# Patient Record
Sex: Male | Born: 1990 | Hispanic: No | Marital: Single | Smoking: Former smoker
Health system: Southern US, Community
[De-identification: ages and names within clinical notes are randomized; demographics above are authoritative.]

## PROBLEM LIST (undated history)

## (undated) DIAGNOSIS — R51 Headache: Secondary | ICD-10-CM

## (undated) DIAGNOSIS — R519 Headache, unspecified: Secondary | ICD-10-CM

## (undated) DIAGNOSIS — B009 Herpesviral infection, unspecified: Secondary | ICD-10-CM

## (undated) HISTORY — PX: NO PAST SURGERIES: SHX2092

---

## 2014-06-27 ENCOUNTER — Encounter (HOSPITAL_COMMUNITY): Payer: Self-pay | Admitting: Emergency Medicine

## 2014-06-27 ENCOUNTER — Emergency Department (INDEPENDENT_AMBULATORY_CARE_PROVIDER_SITE_OTHER)
Admission: EM | Admit: 2014-06-27 | Discharge: 2014-06-27 | Disposition: A | Discharge status: Home / Self Care | Payer: Self-pay | Source: Home / Self Care | Attending: Family Medicine | Admitting: Family Medicine

## 2014-06-27 DIAGNOSIS — B349 Viral infection, unspecified: Secondary | ICD-10-CM

## 2014-06-27 DIAGNOSIS — B001 Herpesviral vesicular dermatitis: Secondary | ICD-10-CM

## 2014-06-27 MED ORDER — VALACYCLOVIR HCL 1 G PO TABS: 1000.0000 mg | ORAL_TABLET | Freq: Two times a day (BID) | ORAL | Status: DC

## 2014-06-27 MED ORDER — VALACYCLOVIR HCL 1 G PO TABS: ORAL_TABLET | ORAL | Status: DC

## 2014-06-27 NOTE — ED Notes (Signed)
Triage delay secondary to this nurse performing procedure in department.

## 2014-06-27 NOTE — Discharge Instructions (Signed)
Cold Sore A cold sore (fever blister) is a skin infection caused by the herpes simplex virus (HSV-1). HSV-1 is closely related to the virus that causes genital herpes (HSV-2), but they are not the same even though both viruses can cause oral and genital infections. Cold sores are small, fluid-filled sores inside of the mouth or on the lips, gums, nose, chin, cheeks, or fingers.  The herpes simplex virus can be easily passed (contagious) to other people through close personal contact, such as kissing or sharing personal items. The virus can also spread to other parts of the body, such as the eyes or genitals. Cold sores are contagious until the sores crust over completely. They often heal within 2 weeks.  Once a person is infected, the herpes simplex virus remains permanently in the body. Therefore, there is no cure for cold sores, and they often recur when a person is tired, stressed, sick, or gets too much sun. Additional factors that can cause a recurrence include hormone changes in menstruation or pregnancy, certain drugs, and cold weather.  CAUSES  Cold sores are caused by the herpes simplex virus. The virus is spread from person to person through close contact, such as through kissing, touching the affected area, or sharing personal items such as lip balm, razors, or eating utensils.  SYMPTOMS  The first infection may not cause symptoms. If symptoms develop, the symptoms often go through different stages. Here is how a cold sore develops:   Tingling, itching, or burning is felt 1-2 days before the outbreak.   Fluid-filled blisters appear on the lips, inside the mouth, nose, or on the cheeks.   The blisters start to ooze clear fluid.   The blisters dry up and a yellow crust appears in its place.   The crust falls off.  Symptoms depend on whether it is the initial outbreak or a recurrence. Some other symptoms with the first outbreak may include:   Fever.   Sore throat.   Headache.    Muscle aches.   Swollen neck glands.  DIAGNOSIS  A diagnosis is often made based on your symptoms and looking at the sores. Sometimes, a sore may be swabbed and then examined in the lab to make a final diagnosis. If the sores are not present, blood tests can find the herpes simplex virus.  TREATMENT  There is no cure for cold sores and no vaccine for the herpes simplex virus. Within 2 weeks, most cold sores go away on their own without treatment. Medicines cannot make the infection go away, but medicine can help relieve some of the pain associated with the sores, can work to stop the virus from multiplying, and can also shorten healing time. Medicine may be in the form of creams, gels, pills, or a shot.  HOME CARE INSTRUCTIONS   Only take over-the-counter or prescription medicines for pain, discomfort, or fever as directed by your caregiver. Do not use aspirin.   Use a cotton-tip swab to apply creams or gels to your sores.   Do not touch the sores or pick the scabs. Wash your hands often. Do not touch your eyes without washing your hands first.   Avoid kissing, oral sex, and sharing personal items until sores heal.   Apply an ice pack on your sores for 10-15 minutes to ease any discomfort.   Avoid hot, cold, or salty foods because they may hurt your mouth. Eat a soft, bland diet to avoid irritating the sores. Use a straw to drink   if you have pain when drinking out of a glass.   Keep sores clean and dry to prevent an infection of other tissues.   Avoid the sun and limit stress if these things trigger outbreaks. If sun causes cold sores, apply sunscreen on the lips before being out in the sun.  SEEK MEDICAL CARE IF:   You have a fever or persistent symptoms for more than 2-3 days.   You have a fever and your symptoms suddenly get worse.   You have pus, not clear fluid, coming from the sores.   You have redness that is spreading.   You have pain or irritation in your  eye.   You get sores on your genitals.   Your sores do not heal within 2 weeks.   You have a weakened immune system.   You have frequent recurrences of cold sores.  MAKE SURE YOU:   Understand these instructions.  Will watch your condition.  Will get help right away if you are not doing well or get worse. Document Released: 08/01/2000 Document Revised: 12/19/2013 Document Reviewed: 12/17/2011 [facility]Exit[facility] Patient Information 2015 [place]ExitCare, [place]LLC. This information is not intended to replace advice given to you by your health care provider. Make sure you discuss any questions you have with your health care provider.  Herpes Labialis You have a fever blister or cold sore (herpes labialis). These painful, grouped sores are caused by one of the herpes viruses (HSV1 most commonly). They are usually found around the lips and mouth, but the same infection can also affect other areas on the face such as the nose and eyes. Herpes infections take about 10 days to heal. They often occur again and again in the same spot. Other symptoms may include numbness and tingling in the involved skin, achiness, fever, and swollen glands in the neck. Colds, emotional stress, injuries, or excess sunlight exposure all seem to make herpes reappear. Herpes lip infections are contagious. Direct contact with these sores can spread the infection. It can also be spread to other parts of your own body. TREATMENT  Herpes labialis is usually self-limited and resolves within 1 week. To reduce pain and swelling, apply ice packs frequently to the sores or suck on popsicles or frozen juice bars. Antiviral medicine may be used by mouth to shorten the duration of the breakout. Avoid spreading the infection by washing your hands often. Be careful not to touch your eyes or genital areas after handling the infected blisters. Do not kiss or have other intimate contact with others. After the blisters are completely healed you may resume  contact. Use sunscreen to lessen recurrences.  If this is your first infection with herpes, or if you have a severe or repeated infections, your caregiver may prescribe one of the anti-viral drugs to speed up the healing. If you have sun-related flare-ups despite the use of sunscreen, starting oral anti-viral medicine before a prolonged exposure (going skiing or to the beach) can prevent most episodes.  SEEK IMMEDIATE MEDICAL CARE IF:  You develop a headache, sleepiness, high fever, vomiting, or severe weakness.  You have eye irritation, pain, blurred vision or redness.  You develop a prolonged infection not getting better in 10 days. Document Released: 08/04/2005 Document Revised: 10/27/2011 Document Reviewed: 06/08/2009 [facility] IncExitCare Patient Information 2015 [place]ExitCare, [place]LLC. This information is not intended to replace advice given to you by your health care provider. Make sure you discuss any questions you have with your health care provider.  Viral Infections A viral  infection can be caused by different types of viruses.Most viral infections are not serious and resolve on their own. However, some infections may cause severe symptoms and may lead to further complications. SYMPTOMS Viruses can frequently cause:  Minor sore throat.  Aches and pains.  Headaches.  Runny nose.  Different types of rashes.  Watery eyes.  Tiredness.  Cough.  Loss of appetite.  Gastrointestinal infections, resulting in nausea, vomiting, and diarrhea. These symptoms do not respond to antibiotics because the infection is not caused by bacteria. However, you might catch a bacterial infection following the viral infection. This is sometimes called a "superinfection." Symptoms of such a bacterial infection may include:  Worsening sore throat with pus and difficulty swallowing.  Swollen neck glands.  Chills and a high or persistent fever.  Severe headache.  Tenderness over the sinuses.  Persistent  overall ill feeling (malaise), muscle aches, and tiredness (fatigue).  Persistent cough.  Yellow, green, or brown mucus production with coughing. HOME CARE INSTRUCTIONS   Only take over-the-counter or prescription medicines for pain, discomfort, diarrhea, or fever as directed by your caregiver.  Drink enough water and fluids to keep your urine clear or pale yellow. Sports drinks can provide valuable electrolytes, sugars, and hydration.  Get plenty of rest and maintain proper nutrition. Soups and broths with crackers or rice are fine. SEEK IMMEDIATE MEDICAL CARE IF:   You have severe headaches, shortness of breath, chest pain, neck pain, or an unusual rash.  You have uncontrolled vomiting, diarrhea, or you are unable to keep down fluids.  You or your child has an oral temperature above 102 F (38.9 C), not controlled by medicine.  Your baby is older than 3 months with a rectal temperature of 102 F (38.9 C) or higher.  Your baby is 793 months old or younger with a rectal temperature of 100.4 F (38 C) or higher. MAKE SURE YOU:   Understand these instructions.  Will watch your condition.  Will get help right away if you are not doing well or get worse. Document Released: 05/14/2005 Document Revised: 10/27/2011 Document Reviewed: 12/09/2010 [facility]ExitCare Patient Information 2015 [place]ExitCare, [place]LLC. This information is not intended to replace advice given to you by your health care provider. Make sure you discuss any questions you have with your health care provider.

## 2014-06-27 NOTE — ED Notes (Signed)
Outbreak of known rash, general not feeling well

## 2014-06-27 NOTE — ED Provider Notes (Signed)
CSN: [id number]     Arrival date & time 06/27/14  1030 History   First MD Initiated Contact with Patient 06/27/14 1056     No chief complaint on file.  (Consider location/radiation/quality/duration/timing/severity/associated sxs/prior Treatment) HPI Comments: Awoke with mild headache and stomach discomfort this AM. C/O rash and requests acyclovir refill.    No past medical history on file. No past surgical history on file. No family history on file. History  Substance Use Topics  . Smoking status: Not on file  . Smokeless tobacco: Not on file  . Alcohol Use: Not on file    Review of Systems  Constitutional: Negative for fever, activity change and fatigue.  HENT: Positive for mouth sores. Negative for postnasal drip, rhinorrhea and sore throat.        Points to right lower lip as the site of a "breaking out".  Eyes: Negative.   Respiratory: Negative.   Cardiovascular: Negative.   Gastrointestinal: Positive for vomiting. Negative for abdominal pain and diarrhea.  Genitourinary: Negative.   Neurological: Negative.     Allergies  Review of patient's allergies indicates not on file.  Home Medications   Prior to Admission medications   Medication Sig Start Date End Date Taking? Authorizing Provider  valACYclovir (VALTREX) 1000 MG tablet Take 1 tablet (1,000 mg total) by mouth 2 (two) times daily. 06/27/14   Hayden Rasmussenavid Catcher Dehoyos, NP   BP 114/76 mmHg  Pulse 54  Temp(Src) 97.6 F (36.4 C) (Oral)  Resp 12  SpO2 100% Physical Exam  Constitutional: He appears well-developed and well-nourished. No distress.  HENT:  Group of papulovesicular lesions to the lower lip.  Eyes: Conjunctivae and EOM are normal.  Neck: Normal range of motion. Neck supple.  Cardiovascular: Normal rate, regular rhythm and normal heart sounds.   Pulmonary/Chest: Effort normal and breath sounds normal. No respiratory distress.  Abdominal: Soft. He exhibits no distension and no mass. There is no tenderness. There  is no rebound and no guarding.  Musculoskeletal: He exhibits no edema.  Lymphadenopathy:    He has no cervical adenopathy.  Neurological: He is alert.  Skin: Skin is warm and dry.  Psychiatric: He has a normal mood and affect.  Nursing note and vitals reviewed.   ED Course  Procedures (including critical care time) Labs Review Labs Reviewed - No data to display  Imaging Review No results found.   MDM   1. Viral syndrome   2. Herpes simplex labialis    valcyclovir 2gm q 12h x 2 d  Fluids, bland foods Tylenol prn  Hayden Rasmussenavid Britanny Marksberry, NP 06/27/14 1513

## 2014-08-13 ENCOUNTER — Emergency Department (INDEPENDENT_AMBULATORY_CARE_PROVIDER_SITE_OTHER)
Admission: EM | Admit: 2014-08-13 | Discharge: 2014-08-13 | Disposition: A | Discharge status: Home / Self Care | Payer: Self-pay | Source: Home / Self Care | Attending: Family Medicine | Admitting: Family Medicine

## 2014-08-13 ENCOUNTER — Encounter (HOSPITAL_COMMUNITY): Payer: Self-pay | Admitting: Emergency Medicine

## 2014-08-13 DIAGNOSIS — B001 Herpesviral vesicular dermatitis: Secondary | ICD-10-CM

## 2014-08-13 HISTORY — DX: Herpesviral infection, unspecified: B00.9

## 2014-08-13 MED ORDER — VALACYCLOVIR HCL 1 G PO TABS: ORAL_TABLET | ORAL | Status: DC

## 2014-08-13 NOTE — ED Notes (Signed)
Patient reports needing refill of valacyclovir. Requesting refill.

## 2014-08-13 NOTE — ED Notes (Signed)
[facility]  Patient not at school this past semester, planning to return this semester

## 2014-08-13 NOTE — ED Provider Notes (Signed)
CSN: [id number]     Arrival date & time 08/13/14  1058 History   First MD Initiated Contact with Patient 08/13/14 1104     Chief Complaint  Patient presents with  . Medication Refill   (Consider location/radiation/quality/duration/timing/severity/associated sxs/prior Treatment) HPI Comments: C/O exacerbation of herpes labialis. Points to Right lower lip. Requests refill on valtrex.   Past Medical History  Diagnosis Date  . Herpes    History reviewed. No pertinent past surgical history. No family history on file. History  Substance Use Topics  . Smoking status: Never Smoker   . Smokeless tobacco: Not on file  . Alcohol Use: No    Review of Systems  All other systems reviewed and are negative.   Allergies  Morphine and related  Home Medications   Prior to Admission medications   Medication Sig Start Date End Date Taking? Authorizing Provider  valACYclovir (VALTREX) 1000 MG tablet Take 2 tabs q 12h for 2 d 08/13/14   Hayden Rasmussenavid Latica Hohmann, NP   BP 123/67 mmHg  Pulse 62  Temp(Src) 98.2 F (36.8 C) (Oral)  Resp 16  SpO2 97% Physical Exam  Constitutional: He is oriented to person, place, and time. He appears well-developed and well-nourished. No distress.  HENT:  Tenderness with minimal swelling to vermillion of lower lip, right side. No specific lesion observed.  Pulmonary/Chest: Effort normal. No respiratory distress.  Neurological: He is alert and oriented to person, place, and time.  Skin: Skin is warm and dry.  Nursing note and vitals reviewed.   ED Course  Procedures (including critical care time) Labs Review Labs Reviewed - No data to display  Imaging Review No results found.   MDM   1. Herpes labialis     valtrex 2 gm bid for 2 d.    Hayden Rasmussenavid Thereasa Iannello, NP 08/13/14 1122

## 2014-08-13 NOTE — Discharge Instructions (Signed)
Cold Sore  A cold sore (fever blister) is a skin infection caused by the herpes simplex virus (HSV-1). HSV-1 is closely related to the virus that causes genital herpes (HSV-2), but they are not the same even though both viruses can cause oral and genital infections. Cold sores are small, fluid-filled sores inside of the mouth or on the lips, gums, nose, chin, cheeks, or fingers.   The herpes simplex virus can be easily passed (contagious) to other people through close personal contact, such as kissing or sharing personal items. The virus can also spread to other parts of the body, such as the eyes or genitals. Cold sores are contagious until the sores crust over completely. They often heal within 2 weeks.   Once a person is infected, the herpes simplex virus remains permanently in the body. Therefore, there is no cure for cold sores, and they often recur when a person is tired, stressed, sick, or gets too much sun. Additional factors that can cause a recurrence include hormone changes in menstruation or pregnancy, certain drugs, and cold weather.   CAUSES   Cold sores are caused by the herpes simplex virus. The virus is spread from person to person through close contact, such as through kissing, touching the affected area, or sharing personal items such as lip balm, razors, or eating utensils.   SYMPTOMS   The first infection may not cause symptoms. If symptoms develop, the symptoms often go through different stages. Here is how a cold sore develops:   · Tingling, itching, or burning is felt 1-2 days before the outbreak.    · Fluid-filled blisters appear on the lips, inside the mouth, nose, or on the cheeks.    · The blisters start to ooze clear fluid.    · The blisters dry up and a yellow crust appears in its place.    · The crust falls off.    Symptoms depend on whether it is the initial outbreak or a recurrence. Some other symptoms with the first outbreak may include:   · Fever.    · Sore throat.    · Headache.     · Muscle aches.    · Swollen neck glands.    DIAGNOSIS   A diagnosis is often made based on your symptoms and looking at the sores. Sometimes, a sore may be swabbed and then examined in the lab to make a final diagnosis. If the sores are not present, blood tests can find the herpes simplex virus.   TREATMENT   There is no cure for cold sores and no vaccine for the herpes simplex virus. Within 2 weeks, most cold sores go away on their own without treatment. Medicines cannot make the infection go away, but medicine can help relieve some of the pain associated with the sores, can work to stop the virus from multiplying, and can also shorten healing time. Medicine may be in the form of creams, gels, pills, or a shot.   HOME CARE INSTRUCTIONS   · Only take over-the-counter or prescription medicines for pain, discomfort, or fever as directed by your caregiver. Do not use aspirin.    · Use a cotton-tip swab to apply creams or gels to your sores.    · Do not touch the sores or pick the scabs. Wash your hands often. Do not touch your eyes without washing your hands first.    · Avoid kissing, oral sex, and sharing personal items until sores heal.    · Apply an ice pack on your sores for 10-15 minutes to ease any discomfort.    ·   Avoid hot, cold, or salty foods because they may hurt your mouth. Eat a soft, bland diet to avoid irritating the sores. Use a straw to drink if you have pain when drinking out of a glass.    · Keep sores clean and dry to prevent an infection of other tissues.    · Avoid the sun and limit stress if these things trigger outbreaks. If sun causes cold sores, apply sunscreen on the lips before being out in the sun.    SEEK MEDICAL CARE IF:   · You have a fever or persistent symptoms for more than 2-3 days.    · You have a fever and your symptoms suddenly get worse.    · You have pus, not clear fluid, coming from the sores.    · You have redness that is spreading.    · You have pain or irritation in your  eye.    · You get sores on your genitals.    · Your sores do not heal within 2 weeks.    · You have a weakened immune system.    · You have frequent recurrences of cold sores.    MAKE SURE YOU:   · Understand these instructions.  · Will watch your condition.  · Will get help right away if you are not doing well or get worse.  Document Released: 08/01/2000 Document Revised: 12/19/2013 Document Reviewed: 12/17/2011  ExitCare® Patient Information ©2015 [employer], [employer]. This information is not intended to replace advice given to you by your health care provider. Make sure you discuss any questions you have with your health care provider.

## 2016-02-25 ENCOUNTER — Ambulatory Visit (HOSPITAL_COMMUNITY)
Admission: EM | Admit: 2016-02-25 | Discharge: 2016-02-25 | Disposition: A | Discharge status: Home / Self Care | Payer: BLUE CROSS/BLUE SHIELD | Attending: Emergency Medicine | Admitting: Emergency Medicine

## 2016-02-25 ENCOUNTER — Encounter (HOSPITAL_COMMUNITY): Payer: Self-pay | Admitting: Emergency Medicine

## 2016-02-25 DIAGNOSIS — B001 Herpesviral vesicular dermatitis: Secondary | ICD-10-CM

## 2016-02-25 MED ORDER — ACYCLOVIR 800 MG PO TABS: 800.0000 mg | ORAL_TABLET | Freq: Every day | ORAL | Status: DC

## 2016-02-25 NOTE — Discharge Instructions (Signed)
Cold Sore A cold sore (fever blister) is a skin infection caused by the herpes simplex virus (HSV-1). HSV-1 is closely related to the virus that causes genital herpes (HSV-2), but they are not the same even though both viruses can cause oral and genital infections. Cold sores are small, fluid-filled sores inside of the mouth or on the lips, gums, nose, chin, cheeks, or fingers.  The herpes simplex virus can be easily passed (contagious) to other people through close personal contact, such as kissing or sharing personal items. The virus can also spread to other parts of the body, such as the eyes or genitals. Cold sores are contagious until the sores crust over completely. They often heal within 2 weeks.  Once a person is infected, the herpes simplex virus remains permanently in the body. Therefore, there is no cure for cold sores, and they often recur when a person is tired, stressed, sick, or gets too much sun. Additional factors that can cause a recurrence include hormone changes in menstruation or pregnancy, certain drugs, and cold weather.  CAUSES  Cold sores are caused by the herpes simplex virus. The virus is spread from person to person through close contact, such as through kissing, touching the affected area, or sharing personal items such as lip balm, razors, or eating utensils.  SYMPTOMS  The first infection may not cause symptoms. If symptoms develop, the symptoms often go through different stages. Here is how a cold sore develops:   Tingling, itching, or burning is felt 1-2 days before the outbreak.   Fluid-filled blisters appear on the lips, inside the mouth, nose, or on the cheeks.   The blisters start to ooze clear fluid.   The blisters dry up and a yellow crust appears in its place.   The crust falls off.  Symptoms depend on whether it is the initial outbreak or a recurrence. Some other symptoms with the first outbreak may include:   Fever.   Sore throat.   Headache.    Muscle aches.   Swollen neck glands.  DIAGNOSIS  A diagnosis is often made based on your symptoms and looking at the sores. Sometimes, a sore may be swabbed and then examined in the lab to make a final diagnosis. If the sores are not present, blood tests can find the herpes simplex virus.  TREATMENT  There is no cure for cold sores and no vaccine for the herpes simplex virus. Within 2 weeks, most cold sores go away on their own without treatment. Medicines cannot make the infection go away, but medicine can help relieve some of the pain associated with the sores, can work to stop the virus from multiplying, and can also shorten healing time. Medicine may be in the form of creams, gels, pills, or a shot.  HOME CARE INSTRUCTIONS   Only take over-the-counter or prescription medicines for pain, discomfort, or fever as directed by your caregiver. Do not use aspirin.   Use a cotton-tip swab to apply creams or gels to your sores.   Do not touch the sores or pick the scabs. Wash your hands often. Do not touch your eyes without washing your hands first.   Avoid kissing, oral sex, and sharing personal items until sores heal.   Apply an ice pack on your sores for 10-15 minutes to ease any discomfort.   Avoid hot, cold, or salty foods because they may hurt your mouth. Eat a soft, bland diet to avoid irritating the sores. Use a straw to drink   if you have pain when drinking out of a glass.   Keep sores clean and dry to prevent an infection of other tissues.   Avoid the sun and limit stress if these things trigger outbreaks. If sun causes cold sores, apply sunscreen on the lips before being out in the sun.  SEEK MEDICAL CARE IF:   You have a fever or persistent symptoms for more than 2-3 days.   You have a fever and your symptoms suddenly get worse.   You have pus, not clear fluid, coming from the sores.   You have redness that is spreading.   You have pain or irritation in your  eye.   You get sores on your genitals.   Your sores do not heal within 2 weeks.   You have a weakened immune system.   You have frequent recurrences of cold sores.  MAKE SURE YOU:   Understand these instructions.  Will watch your condition.  Will get help right away if you are not doing well or get worse.   This information is not intended to replace advice given to you by your health care provider. Make sure you discuss any questions you have with your health care provider.   Document Released: 08/01/2000 Document Revised: 08/25/2014 Document Reviewed: 12/17/2011 [employer] Interactive Patient Education 2016 [employer]  

## 2016-02-25 NOTE — ED Provider Notes (Signed)
CSN: [id number]     Arrival date & time 02/25/16  1525 History   First MD Initiated Contact with Patient 02/25/16 1536     No chief complaint on file.  (Consider location/radiation/quality/duration/timing/severity/associated sxs/prior Treatment) Patient is a 25 y.o. male presenting with rash. The history is provided by the patient.  Rash Location:  Face Facial rash location:  Lip Quality: blistering, burning, painful and swelling   Pain details:    Quality:  Throbbing, sore and numbness   Severity:  Moderate   Onset quality:  Sudden   Duration:  1 day   Timing:  Constant   Progression:  Worsening Severity:  Moderate Onset quality:  Sudden Duration:  1 day Timing:  Constant Progression:  Worsening Chronicity:  New Context: sun exposure   Relieved by:  Nothing Worsened by:  Nothing tried Ineffective treatments:  None tried   Past Medical History  Diagnosis Date  . Herpes    No past surgical history on file. No family history on file. Social History  Substance Use Topics  . Smoking status: Never Smoker   . Smokeless tobacco: Not on file  . Alcohol Use: No    Review of Systems  Constitutional: Negative.   HENT: Positive for mouth sores.   Eyes: Negative.   Respiratory: Negative.   Cardiovascular: Negative.   Gastrointestinal: Negative.   Endocrine: Negative.   Genitourinary: Negative.   Musculoskeletal: Negative.   Skin: Positive for rash.  Allergic/Immunologic: Negative.   Neurological: Negative.   Hematological: Negative.   Psychiatric/Behavioral: Negative.     Allergies  Morphine and related  Home Medications   Prior to Admission medications   Medication Sig Start Date End Date Taking? Authorizing Provider  valACYclovir (VALTREX) 1000 MG tablet Take 2 tabs q 12h for 2 d 08/13/14   Hayden Rasmussenavid Mabe, NP   Meds Ordered and Administered this Visit  Medications - No data to display  There were no vitals taken for this visit. No data found.   Physical  Exam  Constitutional: He appears well-developed and well-nourished.  HENT:  Head: Normocephalic and atraumatic.  Right Ear: External ear normal.  Left Ear: External ear normal.  Mouth/Throat: Oropharynx is clear and moist.  Bottom lip with blister and cold sore  Eyes: Conjunctivae and EOM are normal. Pupils are equal, round, and reactive to light.  Neck: Normal range of motion. Neck supple.  Cardiovascular: Regular rhythm and normal heart sounds.   Pulmonary/Chest: Effort normal and breath sounds normal.  Abdominal: Soft. Bowel sounds are normal.    ED Course  Procedures (including critical care time)  Labs Review Labs Reviewed - No data to display  Imaging Review No results found.   Visual Acuity Review  Right Eye Distance:   Left Eye Distance:   Bilateral Distance:    Right Eye Near:   Left Eye Near:    Bilateral Near:         MDM  Cold Sore  Acyclovir 800mg  one po q 5x day for a day #35      Deatra CanterWilliam J Jeremey Bascom, FNP 02/25/16 1605

## 2016-02-25 NOTE — ED Notes (Signed)
[facility] a [facility] on [facility] face.

## 2016-07-30 ENCOUNTER — Encounter (HOSPITAL_COMMUNITY): Payer: Self-pay | Admitting: Emergency Medicine

## 2016-07-30 ENCOUNTER — Ambulatory Visit (HOSPITAL_COMMUNITY)
Admission: EM | Admit: 2016-07-30 | Discharge: 2016-07-30 | Disposition: A | Discharge status: Home / Self Care | Payer: BLUE CROSS/BLUE SHIELD | Attending: Family Medicine | Admitting: Family Medicine

## 2016-07-30 DIAGNOSIS — B001 Herpesviral vesicular dermatitis: Secondary | ICD-10-CM | POA: Diagnosis not present

## 2016-07-30 MED ORDER — ACYCLOVIR 800 MG PO TABS: 800.0000 mg | ORAL_TABLET | Freq: Every day | ORAL | 11 refills | Status: DC

## 2016-07-30 NOTE — Discharge Instructions (Signed)
Take the medicine as directed. I've given 11 refills.

## 2016-07-30 NOTE — ED Provider Notes (Signed)
[facility]    CSN: [id number] Arrival date & time: 07/30/16  1240     History   Chief Complaint Chief Complaint  Patient presents with  . Mouth Lesions    HPI Clarence Carr is a 25 y.o. male.   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 He's been treated with valacyclovir in the past as well as acyclovir. He prefers the latter because is less money.  The rash has started about 2 days ago and has become painful.  He works at [employer].      Past Medical History:  Diagnosis Date  . Herpes     There are no active problems to display for this patient.   History reviewed. No pertinent surgical history.     Home Medications    Prior to Admission medications   Medication Sig Start Date End Date Taking? Authorizing Provider  acyclovir (ZOVIRAX) 800 MG tablet Take 1 tablet (800 mg total) by mouth 5 (five) times daily. 07/30/16   Elvina SidleKurt Kaytee Taliercio, MD    Family History History reviewed. No pertinent family history.  Social History Social History  Substance Use Topics  . Smoking status: Never Smoker  . Smokeless tobacco: Never Used  . Alcohol use No     Allergies   Morphine and related   Review of Systems Review of Systems  Skin: Positive for rash.  All other systems reviewed and are negative.    Physical Exam Triage Vital Signs ED Triage Vitals [07/30/16 1255]  Enc Vitals Group     BP 130/74     Pulse Rate 64     Resp      Temp 98.4 F (36.9 C)     Temp Source Oral     SpO2 99 %     Weight      Height      Head Circumference      Peak Flow      Pain Score 0     Pain Loc      Pain Edu?      Excl. in GC?    No data found.   Updated Vital Signs BP 130/74 (BP Location: Left Arm)   Pulse 64   Temp 98.4 F (36.9 C) (Oral)   SpO2 99%    Physical Exam  Constitutional: He is oriented to person, place, and time. He appears well-developed and well-nourished.  HENT:  Right Ear: External ear  normal.  Left Ear: External ear normal.  Fasciculating sore on lower lip  Eyes: Conjunctivae and EOM are normal.  Neck: Normal range of motion. Neck supple.  Pulmonary/Chest: Effort normal.  Musculoskeletal: Normal range of motion.  Neurological: He is alert and oriented to person, place, and time.  Nursing note and vitals reviewed.    UC Treatments / Results  Labs (all labs ordered are listed, but only abnormal results are displayed) Labs Reviewed - No data to display  EKG  EKG Interpretation None       Radiology No results found.  Procedures Procedures (including critical care time)  Medications Ordered in UC Medications - No data to display   Initial Impression / Assessment and Plan / UC Course  I have reviewed the triage vital signs and the nursing notes.  Pertinent labs & imaging results that were available during my care of the patient were reviewed by me and considered in my medical decision making (see chart for details).  Clinical Course  Final Clinical Impressions(s) / UC Diagnoses   Final diagnoses:  Cold sore    New Prescriptions Current Discharge Medication List    Acyclovir prescription written   Elvina SidleKurt Roderic Lammert, MD 07/30/16 1319

## 2016-07-30 NOTE — ED Triage Notes (Signed)
Pt has a recurrent mouth lesion on his bottom lip.  Pt states he broke out about three days ago.  He has been treated here previously for the same issue.

## 2016-10-06 ENCOUNTER — Ambulatory Visit (HOSPITAL_COMMUNITY)
Admission: EM | Admit: 2016-10-06 | Discharge: 2016-10-06 | Disposition: A | Discharge status: Home / Self Care | Payer: BLUE CROSS/BLUE SHIELD | Attending: Internal Medicine | Admitting: Internal Medicine

## 2016-10-06 ENCOUNTER — Encounter (HOSPITAL_COMMUNITY): Payer: Self-pay | Admitting: Emergency Medicine

## 2016-10-06 DIAGNOSIS — R059 Cough, unspecified: Secondary | ICD-10-CM

## 2016-10-06 DIAGNOSIS — R05 Cough: Secondary | ICD-10-CM | POA: Diagnosis not present

## 2016-10-06 DIAGNOSIS — R6889 Other general symptoms and signs: Secondary | ICD-10-CM

## 2016-10-06 MED ORDER — IPRATROPIUM BROMIDE 0.06 % NA SOLN: 2.0000 | Freq: Four times a day (QID) | NASAL | 0 refills | Status: DC

## 2016-10-06 MED ORDER — OSELTAMIVIR PHOSPHATE 75 MG PO CAPS: 75.0000 mg | ORAL_CAPSULE | Freq: Two times a day (BID) | ORAL | 0 refills | Status: DC

## 2016-10-06 MED ORDER — BENZONATATE 100 MG PO CAPS: 100.0000 mg | ORAL_CAPSULE | Freq: Three times a day (TID) | ORAL | 0 refills | Status: DC

## 2016-10-06 NOTE — ED Triage Notes (Signed)
[facility] a [facility] and general body aches x 2 days. The patient needs a work note for yesterday.

## 2016-10-06 NOTE — ED Provider Notes (Signed)
CSN: [id number]     Arrival date & time 10/06/16  1306 History   First MD Initiated Contact with Patient 10/06/16 1445     Chief Complaint  Patient presents with  . Generalized Body Aches  . Headache   (Consider location/radiation/quality/duration/timing/severity/associated sxs/prior Treatment) Patient c/o headache, body aches x 2 days.     The history is provided by the patient.  Headache  Pain location:  Generalized Radiates to:  Does not radiate Severity currently:  5/10 Severity at highest:  8/10 Onset quality:  Sudden Duration:  2 days Timing:  Constant Progression:  Worsening Chronicity:  New Similar to prior headaches: no   Context: bright light   Relieved by:  None tried Worsened by:  Nothing Associated symptoms: congestion and sore throat     Past Medical History:  Diagnosis Date  . Herpes    History reviewed. No pertinent surgical history. History reviewed. No pertinent family history. Social History  Substance Use Topics  . Smoking status: Never Smoker  . Smokeless tobacco: Never Used  . Alcohol use No    Review of Systems  Constitutional: Negative.   HENT: Positive for congestion and sore throat.   Eyes: Negative.   Respiratory: Negative.   Cardiovascular: Negative.   Endocrine: Negative.   Musculoskeletal: Negative.   Neurological: Positive for headaches.    Allergies  Morphine and related  Home Medications   Prior to Admission medications   Medication Sig Start Date End Date Taking? Authorizing Provider  acyclovir (ZOVIRAX) 800 MG tablet Take 1 tablet (800 mg total) by mouth 5 (five) times daily. 07/30/16  Yes Elvina SidleKurt Lauenstein, MD  dextromethorphan-guaiFENesin [facility](MUCINEX DM) 30-600 MG 12hr tablet Take 1 tablet by mouth 2 (two) times daily.   Yes Historical Provider, MD  benzonatate (TESSALON) 100 MG capsule Take 1 capsule (100 mg total) by mouth every 8 (eight) hours. 10/06/16   Deatra CanterWilliam J Oxford, FNP  ipratropium (ATROVENT) 0.06 % nasal spray  Place 2 sprays into both nostrils 4 (four) times daily. 10/06/16   Deatra CanterWilliam J Oxford, FNP  oseltamivir (TAMIFLU) 75 MG capsule Take 1 capsule (75 mg total) by mouth every 12 (twelve) hours. 10/06/16   Deatra CanterWilliam J Oxford, FNP   Meds Ordered and Administered this Visit  Medications - No data to display  BP 119/71 (BP Location: Right Arm)   Pulse 77   Temp 98.9 F (37.2 C) (Oral)   Resp 18   SpO2 98%  No data found.   Physical Exam  Constitutional: He appears well-developed.  HENT:  Head: Normocephalic and atraumatic.  Right Ear: External ear normal.  Left Ear: External ear normal.  Mouth/Throat: Oropharynx is clear and moist.  Eyes: Conjunctivae and EOM are normal. Pupils are equal, round, and reactive to light.  Neck: Normal range of motion. Neck supple.  Cardiovascular: Normal rate, regular rhythm and normal heart sounds.   Pulmonary/Chest: Breath sounds normal.  Nursing note and vitals reviewed.   Urgent Care Course     Procedures (including critical care time)  Labs Review Labs Reviewed - No data to display  Imaging Review No results found.   Visual Acuity Review  Right Eye Distance:   Left Eye Distance:   Bilateral Distance:    Right Eye Near:   Left Eye Near:    Bilateral Near:         MDM   1. Flu-like symptoms   2. Cough    Tamiflu Tessalon Atrovent Nasal Spray  Push po fluids, rest, tylenol  and motrin otc prn as directed for fever, arthralgias, and myalgias.  Follow up prn if sx's continue or persist.    Deatra Canter, FNP 10/06/16 1536

## 2016-10-06 NOTE — ED Notes (Signed)
Reviewed instructions and script: 3 escribed, acknowledged location ofpharmacy, denies questions, reviewed work note

## 2017-03-30 ENCOUNTER — Ambulatory Visit (HOSPITAL_COMMUNITY)
Admission: EM | Admit: 2017-03-30 | Discharge: 2017-03-30 | Disposition: A | Discharge status: Home / Self Care | Payer: BLUE CROSS/BLUE SHIELD | Attending: Family Medicine | Admitting: Family Medicine

## 2017-03-30 ENCOUNTER — Encounter (HOSPITAL_COMMUNITY): Payer: Self-pay | Admitting: Emergency Medicine

## 2017-03-30 ENCOUNTER — Ambulatory Visit (INDEPENDENT_AMBULATORY_CARE_PROVIDER_SITE_OTHER): Payer: BLUE CROSS/BLUE SHIELD

## 2017-03-30 DIAGNOSIS — S8252XA Displaced fracture of medial malleolus of left tibia, initial encounter for closed fracture: Secondary | ICD-10-CM | POA: Diagnosis not present

## 2017-03-30 DIAGNOSIS — M25572 Pain in left ankle and joints of left foot: Secondary | ICD-10-CM

## 2017-03-30 MED ORDER — ACETAMINOPHEN 325 MG PO TABS
ORAL_TABLET | ORAL | Status: AC
  Filled 2017-03-30: qty 2

## 2017-03-30 MED ORDER — IBUPROFEN 800 MG PO TABS: 800.0000 mg | ORAL_TABLET | Freq: Three times a day (TID) | ORAL | 0 refills | Status: DC | PRN

## 2017-03-30 MED ORDER — ACETAMINOPHEN 325 MG PO TABS
650.0000 mg | ORAL_TABLET | Freq: Once | ORAL | Status: AC
  Administered 2017-03-30: 650 mg via ORAL

## 2017-03-30 NOTE — ED Triage Notes (Signed)
[facility] a [facility] pain and swelling secondary to "twisting" it playing basketball yesterday.

## 2017-03-30 NOTE — ED Provider Notes (Signed)
  [facility]   [id number] 03/30/17 Arrival Time: 1053  ASSESSMENT & PLAN:  1. Acute left ankle pain   2. Closed avulsion fracture of medial malleolus of left tibia, initial encounter     Meds ordered this encounter  Medications  . acetaminophen (TYLENOL) tablet 650 mg  . ibuprofen (ADVIL,MOTRIN) 800 MG tablet    Sig: Take 1 tablet (800 mg total) by mouth every 8 (eight) hours as needed.    Dispense:  21 tablet    Refill:  0    Order Specific Question:   Supervising Provider    Answer:   Mardella LaymanHAGLER, BRIAN [[id number]]    Reviewed expectations re: course of current medical issues. Questions answered. Outlined signs and symptoms indicating need for more acute intervention. Patient verbalized understanding. After Visit Summary given.  Cam walker Referral to Ortho  SUBJECTIVE:  Clarence Carr is a 26 y.o. male who presents with complaint of severe left ankle pain.  He twisted his left ankle playing basketball yesterday.  ROS: As per HPI.   OBJECTIVE:  Vitals:   03/30/17 1142  BP: 127/74  Pulse: (!) 57  Resp: 18  Temp: 98 F (36.7 C)  TempSrc: Oral  SpO2: 98%     General appearance: alert; no distress HEENT: normocephalic; atraumatic; conjunctivae normal; TMs normal; nasal mucosa normal; oral mucosa normal Neck: supple Lungs: clear to auscultation bilaterally MS - Left ankle with swelling and discomfort bilateral malleolus. Heart: regular rate and rhythm Neurologic: normal symmetric reflexes; normal gait Psychological:  alert and cooperative; normal mood and affect  No results found for this or any previous visit.  Labs Reviewed - No data to display  Dg Ankle Complete Left  Result Date: 03/30/2017 CLINICAL DATA:  Pain after that injury while playing basketball EXAM: LEFT ANKLE COMPLETE - 3+ VIEW COMPARISON:  None. FINDINGS: Frontal, oblique, and lateral views were obtained. There is generalized soft tissue swelling. There is a focal calcification in  medial malleolus with slightly irregular cortical margins, suspicious for an acute avulsion. There is a small calcification with smooth margins laterally, likely residua of previous avulsion type injury laterally. No other evidence suggesting fracture. No joint effusion. Ankle mortise appears intact. No appreciable joint space narrowing or erosion. IMPRESSION: Diffuse soft tissue swelling. Suspect acute avulsion medial malleolus. Probable residua of prior avulsion laterally. Ankle mortise appears intact. No other evidence of fracture. No joint effusion demonstrable. No appreciable arthropathic change. These results will be called to the ordering clinician or representative by the Radiologist Assistant, and communication documented in the PACS or zVision Dashboard. Electronically Signed   By: Bretta BangWilliam  Woodruff III M.D.   On: 03/30/2017 12:23    Allergies  Allergen Reactions  . Morphine And Related     PMHx, SurgHx, SocialHx, Medications, and Allergies were reviewed in the Visit Navigator and updated as appropriate.      Clarence Carr, Clarence Carr, Clarence Carr 03/30/17 1705

## 2017-04-08 ENCOUNTER — Ambulatory Visit (INDEPENDENT_AMBULATORY_CARE_PROVIDER_SITE_OTHER): Payer: BLUE CROSS/BLUE SHIELD | Admitting: Emergency Medicine

## 2017-04-08 ENCOUNTER — Encounter: Payer: Self-pay | Admitting: Emergency Medicine

## 2017-04-08 VITALS — BP 100/59 | HR 68 | Temp 99.5°F | Resp 16 | Ht 67.5 in | Wt 160.2 lb

## 2017-04-08 DIAGNOSIS — S82892S Other fracture of left lower leg, sequela: Secondary | ICD-10-CM

## 2017-04-08 DIAGNOSIS — S99912S Unspecified injury of left ankle, sequela: Secondary | ICD-10-CM

## 2017-04-08 DIAGNOSIS — S99912A Unspecified injury of left ankle, initial encounter: Secondary | ICD-10-CM | POA: Insufficient documentation

## 2017-04-08 NOTE — Patient Instructions (Addendum)
   IF you received an x-ray today, you will receive an invoice from [facility]. Please contact [facility] at [phone] with questions or concerns regarding your invoice.   IF you received labwork today, you will receive an invoice from [facility]. Please contact [facility] at [phone] with questions or concerns regarding your invoice.   Our billing staff will not be able to assist you with questions regarding bills from these companies.  You will be contacted with the lab results as soon as they are available. The fastest way to get your results is to activate your My Chart account. Instructions are located on the last page of this paperwork. If you have not heard from us regarding the results in 2 weeks, please contact this office.     Ankle Pain Many things can cause ankle pain, including an injury to the area and overuse of the ankle.The ankle joint holds your body weight and allows you to move around. Ankle pain can occur on either side or the back of one ankle or both ankles. Ankle pain may be sharp and burning or dull and aching. There may be tenderness, stiffness, redness, or warmth around the ankle. Follow these instructions at home: Activity  Rest your ankle as told by your health care provider. Avoid any activities that cause ankle pain.  Do exercises as told by your health care provider.  Ask your health care provider if you can drive. Using a brace, a bandage, or crutches  If you were given a brace: ? Wear it as told by your health care provider. ? Remove it when you take a bath or a shower. ? Try not to move your ankle very much, but wiggle your toes from time to time. This helps to prevent swelling.  If you were given an elastic bandage: ? Remove it when you take a bath or a shower. ? Try not to move your ankle very much, but wiggle your toes from time to time. This helps to prevent swelling. ? Adjust the bandage to make it more comfortable if  it feels too tight. ? Loosen the bandage if you have numbness or tingling in your foot or if your foot turns cold and blue.  If you have crutches, use them as told by your health care provider. Continue to use them until you can walk without feeling pain in your ankle. Managing pain, stiffness, and swelling  Raise (elevate) your ankle above the level of your heart while you are sitting or lying down.  If directed, apply ice to the area: ? Put ice in a plastic bag. ? Place a towel between your skin and the bag. ? Leave the ice on for 20 minutes, 2-3 times per day. General instructions  Keep all follow-up visits as told by your health care provider. This is important.  Record this information that may be helpful for you and your health care provider: ? How often you have ankle pain. ? Where the pain is located. ? What the pain feels like.  Take over-the-counter and prescription medicines only as told by your health care provider. Contact a health care provider if:  Your pain gets worse.  Your pain is not relieved with medicines.  You have a fever or chills.  You are having more trouble with walking.  You have new symptoms. Get help right away if:  Your foot, leg, toes, or ankle tingles or becomes numb.  Your foot, leg, toes, or ankle becomes swollen.    Your foot, leg, toes, or ankle turns pale or blue. This information is not intended to replace advice given to you by your health care provider. Make sure you discuss any questions you have with your health care provider. Document Released: 01/22/2010 Document Revised: 04/04/2016 Document Reviewed: 03/06/2015 Elsevier Interactive Patient Education  2017 Elsevier Inc.  

## 2017-04-08 NOTE — Progress Notes (Signed)
Clarence Carr 26 y.o.   Chief Complaint  Patient presents with  . Establish Care  . Ankle Injury    LEFT  X 3 DAYS    HISTORY OF PRESENT ILLNESS: This is a 26 y.o. male injured left ankle 10 days ago while playing basketball; rolled his ankle. Was seen in [facility] and diagnosed with avulsion fracture; has ortho boot on and is using crutches.  HPI   Prior to Admission medications   Medication Sig Start Date End Date Taking? Authorizing Provider  ibuprofen (ADVIL,MOTRIN) 800 MG tablet Take 1 tablet (800 mg total) by mouth every 8 (eight) hours as needed. Patient not taking: Reported on 04/08/2017 03/30/17   Deatra Canter, FNP    Allergies  Allergen Reactions  . Morphine And Related     There are no active problems to display for this patient.   Past Medical History:  Diagnosis Date  . Herpes     No past surgical history on file.  Social History   Social History  . Marital status: Single    Spouse name: N/A  . Number of children: N/A  . Years of education: N/A   Occupational History  . Not on file.   Social History Main Topics  . Smoking status: Never Smoker  . Smokeless tobacco: Never Used  . Alcohol use No  . Drug use: No  . Sexual activity: Not on file   Other Topics Concern  . Not on file   Social History Narrative  . No narrative on file    No family history on file.   Review of Systems  Constitutional: Negative.  Negative for chills and fever.  Respiratory: Negative.  Negative for cough and shortness of breath.   Cardiovascular: Negative.  Negative for chest pain and palpitations.  Gastrointestinal: Negative.  Negative for abdominal pain, nausea and vomiting.  Genitourinary: Negative.   Skin: Negative.   Neurological: Negative.   Endo/Heme/Allergies: Negative.   All other systems reviewed and are negative.     Vitals:   04/08/17 1627  BP: (!) 100/59  Pulse: 68  Resp: 16  Temp: 99.5 F (37.5 C)  SpO2: 98%    Physical Exam    Constitutional: He is oriented to person, place, and time. He appears well-developed and well-nourished.  HENT:  Head: Normocephalic and atraumatic.  Eyes: Pupils are equal, round, and reactive to light.  Neck: Normal range of motion.  Cardiovascular: Normal rate and regular rhythm.   Pulmonary/Chest: Effort normal and breath sounds normal.  Musculoskeletal:  Left ankle: +swelling with LROM  Neurological: He is alert and oriented to person, place, and time. No sensory deficit. He exhibits normal muscle tone.  Skin: Skin is warm and dry. Capillary refill takes less than 2 seconds. No rash noted.  Psychiatric: He has a normal mood and affect. His behavior is normal.  Vitals reviewed.  X-ray reviewed with patient in the room. Report reviewed.  ASSESSMENT & PLAN: Amarri was seen today for establish care and ankle injury.  Diagnoses and all orders for this visit:  Injury of left ankle, sequela  Closed fracture of left ankle, sequela    Patient Instructions       IF you received an x-ray today, you will receive an invoice from [facility]. Please contact [facility] at [phone] with questions or concerns regarding your invoice.   IF you received labwork today, you will receive an invoice from [facility]. Please contact [facility] at [phone] with questions or concerns regarding  your invoice.   Our billing staff will not be able to assist you with questions regarding bills from these companies.  You will be contacted with the lab results as soon as they are available. The fastest way to get your results is to activate your My Chart account. Instructions are located on the last page of this paperwork. If you have not heard from [place] regarding the results in 2 weeks, please contact this office.     Ankle Pain Many things can cause ankle pain, including an injury to the area and overuse of the ankle.The ankle joint holds your body weight and allows you to  move around. Ankle pain can occur on either side or the back of one ankle or both ankles. Ankle pain may be sharp and burning or dull and aching. There may be tenderness, stiffness, redness, or warmth around the ankle. Follow these instructions at home: Activity  Rest your ankle as told by your health care provider. Avoid any activities that cause ankle pain.  Do exercises as told by your health care provider.  Ask your health care provider if you can drive. Using a brace, a bandage, or crutches  If you were given a brace: ? Wear it as told by your health care provider. ? Remove it when you take a bath or a shower. ? Try not to move your ankle very much, but wiggle your toes from time to time. This helps to prevent swelling.  If you were given an elastic bandage: ? Remove it when you take a bath or a shower. ? Try not to move your ankle very much, but wiggle your toes from time to time. This helps to prevent swelling. ? Adjust the bandage to make it more comfortable if it feels too tight. ? Loosen the bandage if you have numbness or tingling in your foot or if your foot turns cold and blue.  If you have crutches, use them as told by your health care provider. Continue to use them until you can walk without feeling pain in your ankle. Managing pain, stiffness, and swelling  Raise (elevate) your ankle above the level of your heart while you are sitting or lying down.  If directed, apply ice to the area: ? Put ice in a plastic bag. ? Place a towel between your skin and the bag. ? Leave the ice on for 20 minutes, 2-3 times per day. General instructions  Keep all follow-up visits as told by your health care provider. This is important.  Record this information that may be helpful for you and your health care provider: ? How often you have ankle pain. ? Where the pain is located. ? What the pain feels like.  Take over-the-counter and prescription medicines only as told by your  health care provider. Contact a health care provider if:  Your pain gets worse.  Your pain is not relieved with medicines.  You have a fever or chills.  You are having more trouble with walking.  You have new symptoms. Get help right away if:  Your foot, leg, toes, or ankle tingles or becomes numb.  Your foot, leg, toes, or ankle becomes swollen.  Your foot, leg, toes, or ankle turns pale or blue. This information is not intended to replace advice given to you by your health care provider. Make sure you discuss any questions you have with your health care provider. Document Released: 01/22/2010 Document Revised: 04/04/2016 Document Reviewed: 03/06/2015 Elsevier Interactive Patient Education  2017 Elsevier Inc.      Edwina Barth, MD [facility]

## 2017-05-15 ENCOUNTER — Encounter: Payer: Self-pay | Admitting: Emergency Medicine

## 2017-05-15 ENCOUNTER — Ambulatory Visit (INDEPENDENT_AMBULATORY_CARE_PROVIDER_SITE_OTHER): Payer: BLUE CROSS/BLUE SHIELD | Admitting: Emergency Medicine

## 2017-05-15 VITALS — BP 106/74 | HR 74 | Temp 98.5°F | Resp 16 | Ht 67.0 in | Wt 158.6 lb

## 2017-05-15 DIAGNOSIS — Z23 Encounter for immunization: Secondary | ICD-10-CM | POA: Diagnosis not present

## 2017-05-15 DIAGNOSIS — R4582 Worries: Secondary | ICD-10-CM | POA: Insufficient documentation

## 2017-05-15 DIAGNOSIS — Z711 Person with feared health complaint in whom no diagnosis is made: Secondary | ICD-10-CM | POA: Insufficient documentation

## 2017-05-15 NOTE — Progress Notes (Signed)
Clarence Carr 26 y.o.   Chief Complaint  Patient presents with  . STD TESTING    per patient wants to be tested    HISTORY OF PRESENT ILLNESS: This is a 26 y.o. male worried about STD's but asymptomatic.  HPI   Prior to Admission medications   Medication Sig Start Date End Date Taking? Authorizing Provider  ibuprofen (ADVIL,MOTRIN) 800 MG tablet Take 1 tablet (800 mg total) by mouth every 8 (eight) hours as needed. Patient not taking: Reported on 04/08/2017 03/30/17   Deatra Canter, FNP    Allergies  Allergen Reactions  . Morphine And Related     Patient Active Problem List   Diagnosis Date Noted  . Closed fracture of left ankle 04/08/2017    Past Medical History:  Diagnosis Date  . Herpes     No past surgical history on file.  Social History   Social History  . Marital status: Single    Spouse name: N/A  . Number of children: N/A  . Years of education: N/A   Occupational History  . Not on file.   Social History Main Topics  . Smoking status: Never Smoker  . Smokeless tobacco: Never Used  . Alcohol use No  . Drug use: No  . Sexual activity: Not on file   Other Topics Concern  . Not on file   Social History Narrative  . No narrative on file    No family history on file.   Review of Systems  Constitutional: Negative for chills and fever.  HENT: Negative for sore throat.   Eyes: Negative for discharge and redness.  Respiratory: Negative for cough and shortness of breath.   Cardiovascular: Negative for chest pain.  Gastrointestinal: Negative for abdominal pain, diarrhea, nausea and vomiting.  Genitourinary: Negative for dysuria and hematuria.  Musculoskeletal: Negative for back pain, myalgias and neck pain.  Skin: Negative for rash.  Neurological: Negative for dizziness and headaches.  Endo/Heme/Allergies: Negative.   All other systems reviewed and are negative.  Vitals:   05/15/17 1219  BP: 106/74  Pulse: 74  Resp: 16  Temp: 98.5  F (36.9 C)  SpO2: 99%     Physical Exam  Constitutional: He is oriented to person, place, and time. He appears well-developed and well-nourished.  HENT:  Head: Normocephalic and atraumatic.  Eyes: Pupils are equal, round, and reactive to light. EOM are normal.  Neck: Normal range of motion.  Cardiovascular: Normal rate and regular rhythm.   Pulmonary/Chest: Effort normal.  Neurological: He is alert and oriented to person, place, and time.  Skin: Skin is warm and dry.  Psychiatric: He has a normal mood and affect. His behavior is normal.  Vitals reviewed.    ASSESSMENT & PLAN: Clarence Carr was seen today for std testing.  Diagnoses and all orders for this visit:  Worries -     GC/Chlamydia Probe Amp -     Hepatitis C antibody -     HIV antibody -     RPR -     HSV(herpes simplex vrs) 1+2 ab-IgG  Concern about STD in male without diagnosis -     GC/Chlamydia Probe Amp -     Hepatitis C antibody -     HIV antibody -     RPR -     HSV(herpes simplex vrs) 1+2 ab-IgG  Need for diphtheria-tetanus-pertussis (Tdap) vaccine -     Tdap vaccine greater than or equal to 7yo IM   Patient Instructions  IF you received an x-ray today, you will receive an invoice from [facility]. Please contact [facility] at [phone] with questions or concerns regarding your invoice.   IF you received labwork today, you will receive an invoice from [facility]. Please contact [facility] at [phone] with questions or concerns regarding your invoice.   Our billing staff will not be able to assist you with questions regarding bills from these companies.  You will be contacted with the lab results as soon as they are available. The fastest way to get your results is to activate your My Chart account. Instructions are located on the last page of this paperwork. If you have not heard from [place] regarding the results in 2 weeks, please contact this office.    Sexually  Transmitted Disease A sexually transmitted disease (STD) is a disease or infection that may be passed (transmitted) from person to person, usually during sexual activity. This may happen by way of saliva, semen, blood, vaginal mucus, or urine. Common STDs include:  Gonorrhea.  Chlamydia.  Syphilis.  HIV and AIDS.  Genital herpes.  Hepatitis B and C.  Trichomonas.  Human papillomavirus (HPV).  Pubic lice.  Scabies.  Mites.  Bacterial vaginosis.  What are the causes? An STD may be caused by bacteria, a virus, or parasites. STDs are often transmitted during sexual activity if one person is infected. However, they may also be transmitted through nonsexual means. STDs may be transmitted after:  Sexual intercourse with an infected person.  Sharing sex toys with an infected person.  Sharing needles with an infected person or using unclean piercing or tattoo needles.  Having intimate contact with the genitals, mouth, or rectal areas of an infected person.  Exposure to infected fluids during birth.  What are the signs or symptoms? Different STDs have different symptoms. Some people may not have any symptoms. If symptoms are present, they may include:  Painful or bloody urination.  Pain in the pelvis, abdomen, vagina, anus, throat, or eyes.  A skin rash, itching, or irritation.  Growths, ulcerations, blisters, or sores in the genital and anal areas.  Abnormal vaginal discharge with or without bad odor.  Penile discharge in men.  Fever.  Pain or bleeding during sexual intercourse.  Swollen glands in the groin area.  Yellow skin and eyes (jaundice). This is seen with hepatitis.  Swollen testicles.  Infertility.  Sores and blisters in the mouth.  How is this diagnosed? To make a diagnosis, your health care provider may:  Take a medical history.  Perform a physical exam.  Take a sample of any discharge to examine.  Swab the throat, cervix, opening to  the penis, rectum, or vagina for testing.  Test a sample of your first morning urine.  Perform blood tests.  Perform a Pap test, if this applies.  Perform a colposcopy.  Perform a laparoscopy.  How is this treated? Treatment depends on the STD. Some STDs may be treated but not cured.  Chlamydia, gonorrhea, trichomonas, and syphilis can be cured with antibiotic medicine.  Genital herpes, hepatitis, and HIV can be treated, but not cured, with prescribed medicines. The medicines lessen symptoms.  Genital warts from HPV can be treated with medicine or by freezing, burning (electrocautery), or surgery. Warts may come back.  HPV cannot be cured with medicine or surgery. However, abnormal areas may be removed from the cervix, vagina, or vulva.  If your diagnosis is confirmed, your recent sexual partners need treatment. This is true even if  they are symptom-free or have a negative culture or evaluation. They should not have sex until their health care providers say it is okay.  Your health care provider may test you for infection again 3 months after treatment.  How is this prevented? Take these steps to reduce your risk of getting an STD:  Use latex condoms, dental dams, and water-soluble lubricants during sexual activity. Do not use petroleum jelly or oils.  Avoid having multiple sex partners.  Do not have sex with someone who has other sex partners.  Do not have sex with anyone you do not know or who is at high risk for an STD.  Avoid risky sex practices that can break your skin.  Do not have sex if you have open sores on your mouth or skin.  Avoid drinking too much alcohol or taking illegal drugs. Alcohol and drugs can affect your judgment and put you in a vulnerable position.  Avoid engaging in oral and anal sex acts.  Get vaccinated for HPV and hepatitis. If you have not received these vaccines in the past, talk to your health care provider about whether one or both might  be right for you.  If you are at risk of being infected with HIV, it is recommended that you take a prescription medicine daily to prevent HIV infection. This is called pre-exposure prophylaxis (PrEP). You are considered at risk if: ? You are a man who has sex with other men (MSM). ? You are a heterosexual man or woman and are sexually active with more than one partner. ? You take drugs by injection. ? You are sexually active with a partner who has HIV.  Talk with your health care provider about whether you are at high risk of being infected with HIV. If you choose to begin PrEP, you should first be tested for HIV. You should then be tested every 3 months for as long as you are taking PrEP.  Contact a health care provider if:  See your health care provider.  Tell your sexual partner(s). They should be tested and treated for any STDs.  Do not have sex until your health care provider says it is okay. Get help right away if: Contact your health care provider right away if:  You have severe abdominal pain.  You are a man and notice swelling or pain in your testicles.  You are a woman and notice swelling or pain in your vagina.  This information is not intended to replace advice given to you by your health care provider. Make sure you discuss any questions you have with your health care provider. Document Released: 10/25/2002 Document Revised: 02/22/2016 Document Reviewed: 02/22/2013 Elsevier Interactive Patient Education  2018 Elsevier Inc.      Edwina Barth, MD [facility]

## 2017-05-15 NOTE — Patient Instructions (Addendum)
   IF you received an x-ray today, you will receive an invoice from [facility]. Please contact [facility] at [phone] with questions or concerns regarding your invoice.   IF you received labwork today, you will receive an invoice from [facility]. Please contact [facility] at [phone] with questions or concerns regarding your invoice.   Our billing staff will not be able to assist you with questions regarding bills from these companies.  You will be contacted with the lab results as soon as they are available. The fastest way to get your results is to activate your My Chart account. Instructions are located on the last page of this paperwork. If you have not heard from us regarding the results in 2 weeks, please contact this office.     Sexually Transmitted Disease A sexually transmitted disease (STD) is a disease or infection that may be passed (transmitted) from person to person, usually during sexual activity. This may happen by way of saliva, semen, blood, vaginal mucus, or urine. Common STDs include:  Gonorrhea.  Chlamydia.  Syphilis.  HIV and AIDS.  Genital herpes.  Hepatitis B and C.  Trichomonas.  Human papillomavirus (HPV).  Pubic lice.  Scabies.  Mites.  Bacterial vaginosis.  What are the causes? An STD may be caused by bacteria, a virus, or parasites. STDs are often transmitted during sexual activity if one person is infected. However, they may also be transmitted through nonsexual means. STDs may be transmitted after:  Sexual intercourse with an infected person.  Sharing sex toys with an infected person.  Sharing needles with an infected person or using unclean piercing or tattoo needles.  Having intimate contact with the genitals, mouth, or rectal areas of an infected person.  Exposure to infected fluids during birth.  What are the signs or symptoms? Different STDs have different symptoms. Some people may not have any  symptoms. If symptoms are present, they may include:  Painful or bloody urination.  Pain in the pelvis, abdomen, vagina, anus, throat, or eyes.  A skin rash, itching, or irritation.  Growths, ulcerations, blisters, or sores in the genital and anal areas.  Abnormal vaginal discharge with or without bad odor.  Penile discharge in men.  Fever.  Pain or bleeding during sexual intercourse.  Swollen glands in the groin area.  Yellow skin and eyes (jaundice). This is seen with hepatitis.  Swollen testicles.  Infertility.  Sores and blisters in the mouth.  How is this diagnosed? To make a diagnosis, your health care provider may:  Take a medical history.  Perform a physical exam.  Take a sample of any discharge to examine.  Swab the throat, cervix, opening to the penis, rectum, or vagina for testing.  Test a sample of your first morning urine.  Perform blood tests.  Perform a Pap test, if this applies.  Perform a colposcopy.  Perform a laparoscopy.  How is this treated? Treatment depends on the STD. Some STDs may be treated but not cured.  Chlamydia, gonorrhea, trichomonas, and syphilis can be cured with antibiotic medicine.  Genital herpes, hepatitis, and HIV can be treated, but not cured, with prescribed medicines. The medicines lessen symptoms.  Genital warts from HPV can be treated with medicine or by freezing, burning (electrocautery), or surgery. Warts may come back.  HPV cannot be cured with medicine or surgery. However, abnormal areas may be removed from the cervix, vagina, or vulva.  If your diagnosis is confirmed, your recent sexual partners need treatment. This is   true even if they are symptom-free or have a negative culture or evaluation. They should not have sex until their health care providers say it is okay.  Your health care provider may test you for infection again 3 months after treatment.  How is this prevented? Take these steps to reduce  your risk of getting an STD:  Use latex condoms, dental dams, and water-soluble lubricants during sexual activity. Do not use petroleum jelly or oils.  Avoid having multiple sex partners.  Do not have sex with someone who has other sex partners.  Do not have sex with anyone you do not know or who is at high risk for an STD.  Avoid risky sex practices that can break your skin.  Do not have sex if you have open sores on your mouth or skin.  Avoid drinking too much alcohol or taking illegal drugs. Alcohol and drugs can affect your judgment and put you in a vulnerable position.  Avoid engaging in oral and anal sex acts.  Get vaccinated for HPV and hepatitis. If you have not received these vaccines in the past, talk to your health care provider about whether one or both might be right for you.  If you are at risk of being infected with HIV, it is recommended that you take a prescription medicine daily to prevent HIV infection. This is called pre-exposure prophylaxis (PrEP). You are considered at risk if: ? You are a man who has sex with other men (MSM). ? You are a heterosexual man or woman and are sexually active with more than one partner. ? You take drugs by injection. ? You are sexually active with a partner who has HIV.  Talk with your health care provider about whether you are at high risk of being infected with HIV. If you choose to begin PrEP, you should first be tested for HIV. You should then be tested every 3 months for as long as you are taking PrEP.  Contact a health care provider if:  See your health care provider.  Tell your sexual partner(s). They should be tested and treated for any STDs.  Do not have sex until your health care provider says it is okay. Get help right away if: Contact your health care provider right away if:  You have severe abdominal pain.  You are a man and notice swelling or pain in your testicles.  You are a woman and notice swelling or pain  in your vagina.  This information is not intended to replace advice given to you by your health care provider. Make sure you discuss any questions you have with your health care provider. Document Released: 10/25/2002 Document Revised: 02/22/2016 Document Reviewed: 02/22/2013 Elsevier Interactive Patient Education  2018 Elsevier Inc.  

## 2017-05-16 LAB — HSV(HERPES SIMPLEX VRS) I + II AB-IGG: HSV 1 Glycoprotein G Ab, IgG: 49.4 index — ABNORMAL HIGH (ref 0.00–0.90)

## 2017-05-16 LAB — RPR: RPR Ser Ql: NONREACTIVE

## 2017-05-16 LAB — HIV ANTIBODY (ROUTINE TESTING W REFLEX): HIV SCREEN 4TH GENERATION: NONREACTIVE

## 2017-05-16 LAB — HEPATITIS C ANTIBODY: Hep C Virus Ab: <0.1 s/co ratio (ref 0.0–0.9)

## 2017-05-19 LAB — GC/CHLAMYDIA PROBE AMP
Chlamydia trachomatis, NAA: NEGATIVE
Neisseria gonorrhoeae by PCR: NEGATIVE

## 2017-05-27 ENCOUNTER — Encounter: Payer: Self-pay | Admitting: Emergency Medicine

## 2017-05-29 ENCOUNTER — Ambulatory Visit: Payer: BLUE CROSS/BLUE SHIELD | Admitting: Emergency Medicine

## 2017-05-29 ENCOUNTER — Ambulatory Visit (INDEPENDENT_AMBULATORY_CARE_PROVIDER_SITE_OTHER): Payer: BLUE CROSS/BLUE SHIELD | Admitting: Emergency Medicine

## 2017-05-29 ENCOUNTER — Encounter: Payer: Self-pay | Admitting: Emergency Medicine

## 2017-05-29 VITALS — BP 112/70 | HR 62 | Temp 98.7°F | Resp 16 | Ht 67.0 in | Wt 162.4 lb

## 2017-05-29 DIAGNOSIS — S82892S Other fracture of left lower leg, sequela: Secondary | ICD-10-CM | POA: Diagnosis not present

## 2017-05-29 DIAGNOSIS — S99912S Unspecified injury of left ankle, sequela: Secondary | ICD-10-CM

## 2017-05-29 NOTE — Patient Instructions (Signed)
     IF you received an x-ray today, you will receive an invoice from [facility]. Please contact [facility] at [phone] with questions or concerns regarding your invoice.   IF you received labwork today, you will receive an invoice from [facility]. Please contact [facility] at [phone] with questions or concerns regarding your invoice.   Our billing staff will not be able to assist you with questions regarding bills from these companies.  You will be contacted with the lab results as soon as they are available. The fastest way to get your results is to activate your My Chart account. Instructions are located on the last page of this paperwork. If you have not heard from us regarding the results in 2 weeks, please contact this office.     

## 2017-05-29 NOTE — Progress Notes (Signed)
Clarence Carr 26 y.o.   Chief Complaint  Patient presents with  . Follow-up    LEFT ANKLE    HISTORY OF PRESENT ILLNESS: This is a 26 y.o. male 8 weeks post left ankle injury; played basketball last Sunday. Feels fine; wants to go back to work next Monday.  HPI   Prior to Admission medications   Medication Sig Start Date End Date Taking? Authorizing Provider  ibuprofen (ADVIL,MOTRIN) 800 MG tablet Take 1 tablet (800 mg total) by mouth every 8 (eight) hours as needed. Patient not taking: Reported on 04/08/2017 03/30/17   Deatra Canter, FNP    Allergies  Allergen Reactions  . Morphine And Related     Patient Active Problem List   Diagnosis Date Noted  . Concern about STD in male without diagnosis 05/15/2017  . Worries 05/15/2017  . Need for diphtheria-tetanus-pertussis (Tdap) vaccine 05/15/2017  . Closed fracture of left ankle 04/08/2017    Past Medical History:  Diagnosis Date  . Herpes     No past surgical history on file.  Social History   Social History  . Marital status: Single    Spouse name: N/A  . Number of children: N/A  . Years of education: N/A   Occupational History  . Not on file.   Social History Main Topics  . Smoking status: Never Smoker  . Smokeless tobacco: Never Used  . Alcohol use No  . Drug use: No  . Sexual activity: Not on file   Other Topics Concern  . Not on file   Social History Narrative  . No narrative on file    No family history on file.   Review of Systems  Constitutional: Negative.  Negative for fever.  Respiratory: Negative for shortness of breath.   Gastrointestinal: Negative for nausea and vomiting.  Musculoskeletal: Negative for joint pain.  Skin: Negative for rash.  Neurological: Negative for sensory change and speech change.  All other systems reviewed and are negative.  Vitals:   05/29/17 1732  BP: 112/70  Pulse: 62  Resp: 16  Temp: 98.7 F (37.1 C)  SpO2: 98%     Physical Exam   Constitutional: He is oriented to person, place, and time. He appears well-developed and well-nourished.  HENT:  Head: Normocephalic.  Neck: Normal range of motion.  Cardiovascular: Normal rate and regular rhythm.   Pulmonary/Chest: Effort normal.  Musculoskeletal:  Left ankle: FROM; no swelling or tenderness.  Neurological: He is alert and oriented to person, place, and time.  Skin: Skin is warm and dry. Capillary refill takes less than 2 seconds.  Psychiatric: He has a normal mood and affect. His behavior is normal.  Vitals reviewed.    ASSESSMENT & PLAN: Clarence Carr was seen today for follow-up.  Diagnoses and all orders for this visit:  Injury of left ankle, sequela  Closed fracture of left ankle, sequela  Work note printed. Able to return to work next Monday 06/01/17.   Edwina Barth, MD [facility]

## 2017-09-09 ENCOUNTER — Telehealth: Payer: Self-pay | Admitting: Emergency Medicine

## 2017-09-09 NOTE — Telephone Encounter (Signed)
Provider, please advise.  

## 2017-09-09 NOTE — Telephone Encounter (Signed)
Will route to [facility] pool for approval; see CRM # [id number].

## 2017-09-09 NOTE — Telephone Encounter (Signed)
Copied from CRM [phone]. Topic: Quick Communication - Rx Refill/Question >> Sep 09, 2017  8:33 AM Crist InfanteHarrald, Kathy J wrote: Medication acyclovir (ZOVIRAX) 800 MG tablet   35 tabs w/ refills if possible  Has the patient contacted their pharmacy? {yes  but Dr Alvy BimlerSagardia has never filled before.  Previous dr did the Rx.  Pt states he has been trying for 2 weeks to get refilled, but did not realize current MD had never filled this Rx. [facility] [address] - [place], [place] - [address] [phone] (Phone) [phone] (Fax)

## 2017-09-10 NOTE — Telephone Encounter (Signed)
Not appropriate to prescribe without specific evaluation. Needs OV for evaluation. We have never seen him for this problem. Thanks.

## 2017-09-14 NOTE — Telephone Encounter (Signed)
Patient has appointment 09/16/2017

## 2017-09-16 ENCOUNTER — Other Ambulatory Visit: Payer: Self-pay

## 2017-09-16 ENCOUNTER — Encounter: Payer: Self-pay | Admitting: Emergency Medicine

## 2017-09-16 ENCOUNTER — Ambulatory Visit (INDEPENDENT_AMBULATORY_CARE_PROVIDER_SITE_OTHER): Payer: BLUE CROSS/BLUE SHIELD | Admitting: Emergency Medicine

## 2017-09-16 VITALS — BP 100/70 | HR 78 | Temp 98.1°F | Resp 16 | Ht 67.0 in | Wt 164.0 lb

## 2017-09-16 DIAGNOSIS — Z8619 Personal history of other infectious and parasitic diseases: Secondary | ICD-10-CM | POA: Diagnosis not present

## 2017-09-16 DIAGNOSIS — M25472 Effusion, left ankle: Secondary | ICD-10-CM

## 2017-09-16 MED ORDER — ACYCLOVIR 400 MG PO TABS: 400.0000 mg | ORAL_TABLET | Freq: Every day | ORAL | 10 refills | Status: AC

## 2017-09-16 NOTE — Progress Notes (Signed)
Clarence Carr 27 y.o.   Chief Complaint  Patient presents with  . Edema    per patient has questions about his LEFT ankle swelling after work    HISTORY OF PRESENT ILLNESS: This is a 27 y.o. male concerned about intermittent swelling to left ankle mostly after work; had sprained ankle last summer while playing basketball; concerned about healing process. Also has h/o herpes labialis (cold sores) and requests Acyclovir prescription.  HPI   Prior to Admission medications   Medication Sig Start Date End Date Taking? Authorizing Provider  ibuprofen (ADVIL,MOTRIN) 800 MG tablet Take 1 tablet (800 mg total) by mouth every 8 (eight) hours as needed. 03/30/17  Yes Deatra Canter, FNP  acyclovir (ZOVIRAX) 400 MG tablet Take 1 tablet (400 mg total) by mouth 5 (five) times daily for 7 days. 09/16/17 09/23/17  Georgina Quint, MD    Allergies  Allergen Reactions  . Morphine And Related     Patient Active Problem List   Diagnosis Date Noted  . 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 Edema of left ankle 09/16/2017  . Concern about STD in male without diagnosis 05/15/2017  . Worries 05/15/2017  . Need for diphtheria-tetanus-pertussis (Tdap) vaccine 05/15/2017  . Injury of left ankle 04/08/2017    Past Medical History:  Diagnosis Date  . Herpes     History reviewed. No pertinent surgical history.  Social History   Socioeconomic History  . Marital status: Single    Spouse name: Not on file  . Number of children: Not on file  . Years of education: Not on file  . Highest education level: Not on file  Social Needs  . Financial resource strain: Not on file  . Food insecurity - worry: Not on file  . Food insecurity - inability: Not on file  . Transportation needs - medical: Not on file  . Transportation needs - non-medical: Not on file  Occupational History  . Not on file  Tobacco Use  . Smoking status: Never Smoker  . Smokeless tobacco: Never Used  Substance and  Sexual Activity  . Alcohol use: No  . Drug use: No  . Sexual activity: Not on file  Other Topics Concern  . Not on file  Social History Narrative  . Not on file    History reviewed. No pertinent family history.   Review of Systems  Constitutional: Negative.  Negative for chills and fever.  HENT: Negative for sore throat.   Respiratory: Negative for shortness of breath.   Cardiovascular: Negative for chest pain and palpitations.  Gastrointestinal: Negative for nausea and vomiting.  Neurological: Negative for dizziness and headaches.    Vitals:   09/16/17 1343  BP: 100/70  Pulse: 78  Resp: 16  Temp: 98.1 F (36.7 C)  SpO2: 98%    Physical Exam  Constitutional: He is oriented to person, place, and time. He appears well-developed and well-nourished.  HENT:  Head: Normocephalic.  Eyes: EOM are normal. Pupils are equal, round, and reactive to light.  Neck: Normal range of motion.  Cardiovascular: Normal rate.  Pulmonary/Chest: Effort normal.  Musculoskeletal: Normal range of motion.  Left ankle: FROM, no erythema, swelling or tenderness. Left foot: NVI with FROM.  Neurological: He is alert and oriented to person, place, and time.  Skin: Skin is warm and dry. Capillary refill takes less than 2 seconds. No rash noted.  Psychiatric: He has a normal mood and affect. His behavior is normal.  Vitals reviewed.  ASSESSMENT & PLAN: Clarence Carr was seen today for edema.  Diagnoses and all orders for this visit:  Edema of left ankle  History of herpes labialis -     acyclovir (ZOVIRAX) 400 MG tablet; Take 1 tablet (400 mg total) by mouth 5 (five) times daily for 7 days.    Patient Instructions       IF you received an x-ray today, you will receive an invoice from [facility]. Please contact [facility] at [phone] with questions or concerns regarding your invoice.   IF you received labwork today, you will receive an invoice from [facility].  Please contact [facility] at [phone] with questions or concerns regarding your invoice.   Our billing staff will not be able to assist you with questions regarding bills from these companies.  You will be contacted with the lab results as soon as they are available. The fastest way to get your results is to activate your My Chart account. Instructions are located on the last page of this paperwork. If you have not heard from us regarding the results in 2 weeks, please contact this office.     Cold Sore A cold sore, also called a fever blister, is a skin infection that is caused by a virus. This infection causes small, fluid-filled sores to form inside of the mouth or on the lips, gums, nose, chin, or cheeks. Cold sores can spread to other parts of the body, such as the eyes or fingers. Cold sores can be spread or passed from person to person (contagious) until the sores crust over completely. Cold sores can be spread through close contact, such as kissing or sharing a drinking glass. Follow these instructions at home: Medicines  Take or apply over-the-counter and prescription medicines only as told by your doctor.  Use a cotton-tip swab to apply creams or gels to your sores. Sore Care  Do not touch the sores or pick the scabs.  Wash your hands often. Do not touch your eyes without washing your hands first.  Keep the sores clean and dry.  If directed, apply ice to the sores:  Put ice in a plastic bag.  Place a towel between your skin and the bag.  Leave the ice on for 20 minutes, 2-3 times per day. Lifestyle  Do not kiss, have oral sex, or share personal items until your sores heal.  Eat a soft, bland diet. Avoid eating hot, cold, or salty foods. These can hurt your mouth.  Use a straw if it hurts to drink out of a glass.  Avoid the sun and limit your stress if these things trigger outbreaks. If sun causes cold sores, apply sunscreen on your lips before being out in the  sun. Contact a doctor if:  You have symptoms for more than two weeks.  You have pus coming from the sores.  You have redness that is spreading.  You have pain or irritation in your eye.  You get sores on your genitals.  Your sores do not heal within two weeks.  You get cold sores often. Get help right away if:  You have a fever and your symptoms suddenly get worse.  You have a headache and confusion. This information is not intended to replace advice given to you by your health care provider. Make sure you discuss any questions you have with your health care provider. Document Released: 02/03/2012 Document Revised: 01/10/2016 Document Reviewed: 05/25/2015 Elsevier Interactive Patient Education  [employer]2018 [employer].  Agustina Caroli, MD [facility]

## 2017-09-16 NOTE — Patient Instructions (Addendum)
     IF you received an x-ray today, you will receive an invoice from [facility]. Please contact [facility] at [phone] with questions or concerns regarding your invoice.   IF you received labwork today, you will receive an invoice from [facility]. Please contact [facility] at [phone] with questions or concerns regarding your invoice.   Our billing staff will not be able to assist you with questions regarding bills from these companies.  You will be contacted with the lab results as soon as they are available. The fastest way to get your results is to activate your My Chart account. Instructions are located on the last page of this paperwork. If you have not heard from us regarding the results in 2 weeks, please contact this office.     Cold Sore A cold sore, also called a fever blister, is a skin infection that is caused by a virus. This infection causes small, fluid-filled sores to form inside of the mouth or on the lips, gums, nose, chin, or cheeks. Cold sores can spread to other parts of the body, such as the eyes or fingers. Cold sores can be spread or passed from person to person (contagious) until the sores crust over completely. Cold sores can be spread through close contact, such as kissing or sharing a drinking glass. Follow these instructions at home: Medicines  Take or apply over-the-counter and prescription medicines only as told by your doctor.  Use a cotton-tip swab to apply creams or gels to your sores. Sore Care  Do not touch the sores or pick the scabs.  Wash your hands often. Do not touch your eyes without washing your hands first.  Keep the sores clean and dry.  If directed, apply ice to the sores:  Put ice in a plastic bag.  Place a towel between your skin and the bag.  Leave the ice on for 20 minutes, 2-3 times per day. Lifestyle  Do not kiss, have oral sex, or share personal items until your sores heal.  Eat a soft, bland diet.  Avoid eating hot, cold, or salty foods. These can hurt your mouth.  Use a straw if it hurts to drink out of a glass.  Avoid the sun and limit your stress if these things trigger outbreaks. If sun causes cold sores, apply sunscreen on your lips before being out in the sun. Contact a doctor if:  You have symptoms for more than two weeks.  You have pus coming from the sores.  You have redness that is spreading.  You have pain or irritation in your eye.  You get sores on your genitals.  Your sores do not heal within two weeks.  You get cold sores often. Get help right away if:  You have a fever and your symptoms suddenly get worse.  You have a headache and confusion. This information is not intended to replace advice given to you by your health care provider. Make sure you discuss any questions you have with your health care provider. Document Released: 02/03/2012 Document Revised: 01/10/2016 Document Reviewed: 05/25/2015 Elsevier Interactive Patient Education  [employer]2018 [employer]

## 2017-12-14 IMAGING — DX DG ANKLE COMPLETE 3+V*L*
3 series · 3 of 3 positions shown · non-contrast
Comparison: None.

CLINICAL DATA: Pain after that injury while playing basketball

EXAM:
LEFT ANKLE COMPLETE - 3+ VIEW

[ankle ap]
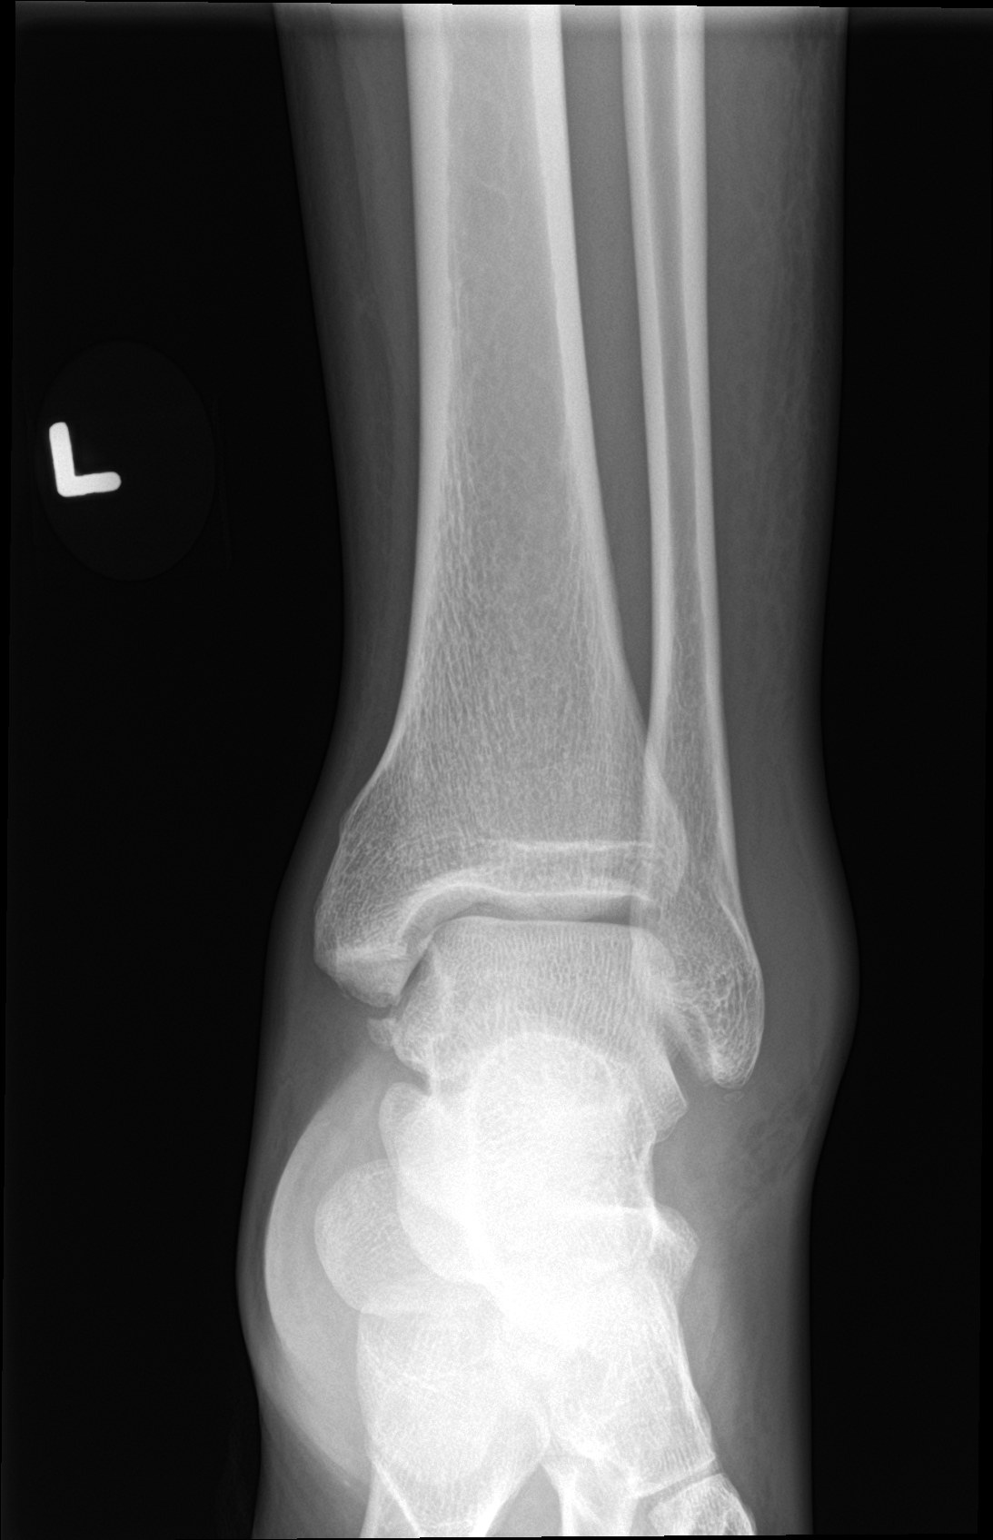

[ankle obl]
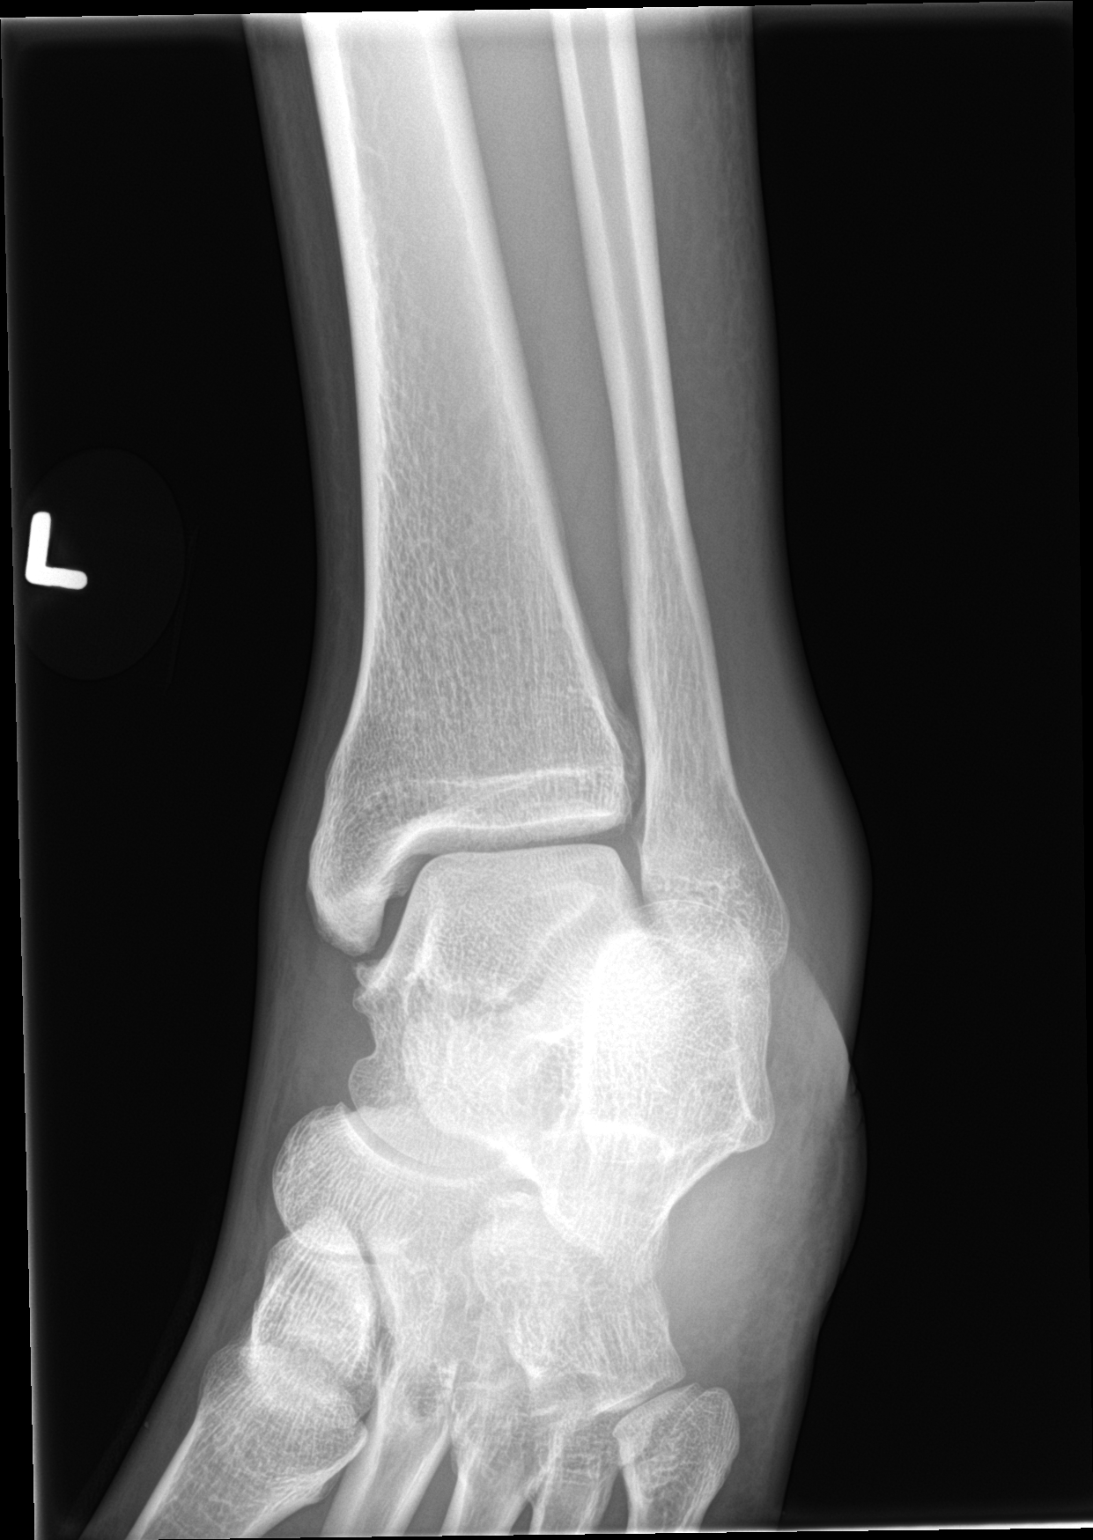

[ankle lat]
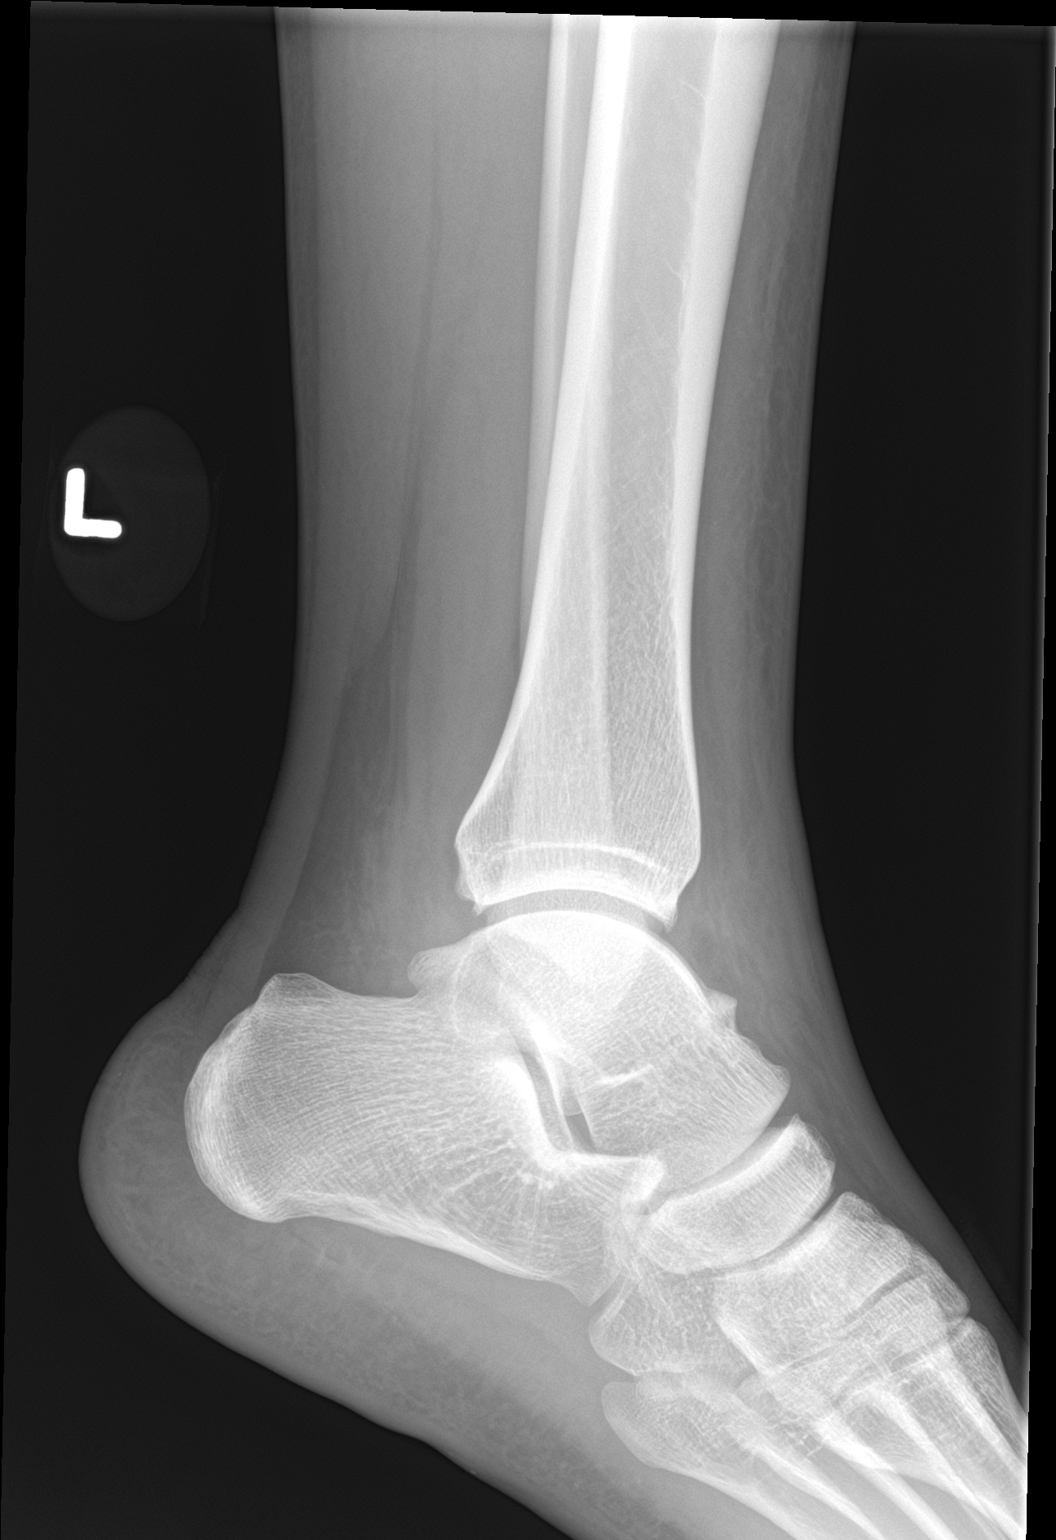

[3 of 3 positions shown; findings below may reference images not displayed]

FINDINGS: Frontal, oblique, and lateral views were obtained. There is
generalized soft tissue swelling. There is a focal calcification in
medial malleolus with slightly irregular cortical margins,
suspicious for an acute avulsion. There is a small calcification
with smooth margins laterally, likely residua of previous avulsion
type injury laterally. No other evidence suggesting fracture. No
joint effusion. Ankle mortise appears intact. No appreciable joint
space narrowing or erosion.
IMPRESSION: Diffuse soft tissue swelling. Suspect acute avulsion medial
malleolus. Probable residua of prior avulsion laterally. Ankle
mortise appears intact. No other evidence of fracture. No joint
effusion demonstrable. No appreciable arthropathic change.

These results will be called to the ordering clinician or
representative by the Radiologist Assistant, and communication
documented in the PACS or zVision Dashboard.

## 2018-04-21 ENCOUNTER — Encounter: Payer: Self-pay | Admitting: Emergency Medicine

## 2018-04-22 ENCOUNTER — Other Ambulatory Visit: Payer: Self-pay | Admitting: Emergency Medicine

## 2018-04-22 ENCOUNTER — Telehealth: Payer: Self-pay | Admitting: Emergency Medicine

## 2018-04-22 NOTE — Telephone Encounter (Unsigned)
Copied from CRM [id number]. Topic: Quick Communication - Rx Refill/Question >> Apr 22, 2018  5:32 PM Mcneil, Ja-Kwan wrote: Medication: acyclovir (ZOVIRAX) 800 MG tablet  Has the patient contacted their pharmacy? No  Preferred Pharmacy (with phone number or street name): [facility] [address], [place] - [address] [phone] (Phone) [phone] (Fax)  Agent: Please be advised that RX refills may take up to 3 business days. We ask that you follow-up with your pharmacy.

## 2018-04-22 NOTE — Telephone Encounter (Signed)
Refill of acyclovir  LRF 09/16/17 #35  10 refills  Pt has been communicating with Dr. Alvy Bimler via MyChart.

## 2018-04-23 MED ORDER — ACYCLOVIR 800 MG PO TABS: 800.0000 mg | ORAL_TABLET | Freq: Every day | ORAL | 2 refills | Status: AC

## 2018-04-23 NOTE — Addendum Note (Signed)
Addended by: Evie Lacks on: 04/23/2018 08:03 AM   Modules accepted: Orders

## 2018-04-30 ENCOUNTER — Other Ambulatory Visit: Payer: Self-pay

## 2018-04-30 ENCOUNTER — Encounter: Payer: Self-pay | Admitting: Family Medicine

## 2018-04-30 ENCOUNTER — Ambulatory Visit (INDEPENDENT_AMBULATORY_CARE_PROVIDER_SITE_OTHER): Payer: BLUE CROSS/BLUE SHIELD

## 2018-04-30 ENCOUNTER — Ambulatory Visit (INDEPENDENT_AMBULATORY_CARE_PROVIDER_SITE_OTHER): Payer: BLUE CROSS/BLUE SHIELD | Admitting: Family Medicine

## 2018-04-30 DIAGNOSIS — M545 Low back pain, unspecified: Secondary | ICD-10-CM

## 2018-04-30 DIAGNOSIS — M542 Cervicalgia: Secondary | ICD-10-CM

## 2018-04-30 DIAGNOSIS — S161XXA Strain of muscle, fascia and tendon at neck level, initial encounter: Secondary | ICD-10-CM

## 2018-04-30 DIAGNOSIS — S46819A Strain of other muscles, fascia and tendons at shoulder and upper arm level, unspecified arm, initial encounter: Secondary | ICD-10-CM

## 2018-04-30 MED ORDER — MELOXICAM 7.5 MG PO TABS: 7.5000 mg | ORAL_TABLET | Freq: Every day | ORAL | 0 refills | Status: DC | PRN

## 2018-04-30 MED ORDER — CYCLOBENZAPRINE HCL 5 MG PO TABS: ORAL_TABLET | ORAL | 0 refills | Status: DC
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
## 2018-07-05 ENCOUNTER — Encounter: Payer: Self-pay | Admitting: Emergency Medicine

## 2018-07-05 ENCOUNTER — Ambulatory Visit (INDEPENDENT_AMBULATORY_CARE_PROVIDER_SITE_OTHER): Payer: BLUE CROSS/BLUE SHIELD | Admitting: Emergency Medicine

## 2018-07-05 ENCOUNTER — Other Ambulatory Visit: Payer: Self-pay

## 2018-07-05 VITALS — BP 113/70 | HR 59 | Temp 98.7°F | Resp 16 | Ht 66.75 in | Wt 170.8 lb

## 2018-07-05 DIAGNOSIS — B0089 Other herpesviral infection: Secondary | ICD-10-CM | POA: Diagnosis not present

## 2018-07-05 MED ORDER — VALACYCLOVIR HCL 1 G PO TABS: 1000.0000 mg | ORAL_TABLET | Freq: Two times a day (BID) | ORAL | 5 refills | Status: AC

## 2018-07-05 NOTE — Patient Instructions (Addendum)
     If you have lab work done today you will be contacted with your lab results within the next 2 weeks.  If you have not heard from us then please contact us. The fastest way to get your results is to register for My Chart.   IF you received an x-ray today, you will receive an invoice from [facility]. Please contact [facility] at [phone] with questions or concerns regarding your invoice.   IF you received labwork today, you will receive an invoice from [facility]. Please contact [facility] at [phone] with questions or concerns regarding your invoice.   Our billing staff will not be able to assist you with questions regarding bills from these companies.  You will be contacted with the lab results as soon as they are available. The fastest way to get your results is to activate your My Chart account. Instructions are located on the last page of this paperwork. If you have not heard from us regarding the results in 2 weeks, please contact this office.     Cold Sore A cold sore, also called a fever blister, is a skin infection that is caused by a virus. This infection causes small, fluid-filled sores to form inside of the mouth or on the lips, gums, nose, chin, or cheeks. Cold sores can spread to other parts of the body, such as the eyes or fingers. Cold sores can be spread or passed from person to person (contagious) until the sores crust over completely. Cold sores can be spread through close contact, such as kissing or sharing a drinking glass. Follow these instructions at home: Medicines  Take or apply over-the-counter and prescription medicines only as told by your doctor.  Use a cotton-tip swab to apply creams or gels to your sores. Sore Care  Do not touch the sores or pick the scabs.  Wash your hands often. Do not touch your eyes without washing your hands first.  Keep the sores clean and dry.  If directed, apply ice to the sores:  Put ice in a  plastic bag.  Place a towel between your skin and the bag.  Leave the ice on for 20 minutes, 2-3 times per day. Lifestyle  Do not kiss, have oral sex, or share personal items until your sores heal.  Eat a soft, bland diet. Avoid eating hot, cold, or salty foods. These can hurt your mouth.  Use a straw if it hurts to drink out of a glass.  Avoid the sun and limit your stress if these things trigger outbreaks. If sun causes cold sores, apply sunscreen on your lips before being out in the sun. Contact a doctor if:  You have symptoms for more than two weeks.  You have pus coming from the sores.  You have redness that is spreading.  You have pain or irritation in your eye.  You get sores on your genitals.  Your sores do not heal within two weeks.  You get cold sores often. Get help right away if:  You have a fever and your symptoms suddenly get worse.  You have a headache and confusion. This information is not intended to replace advice given to you by your health care provider. Make sure you discuss any questions you have with your health care provider. Document Released: 02/03/2012 Document Revised: 01/10/2016 Document Reviewed: 05/25/2015 Elsevier Interactive Patient Education  [employer]2018 Elsevier Inc.

## 2018-07-05 NOTE — Progress Notes (Signed)
Clarence Carr 27 y.o.   Chief Complaint  Patient presents with  . Medication Refill    Valacyclovir per patient he want this medication    HISTORY OF PRESENT ILLNESS: This is a 27 y.o. male complaining of painful blistering rash to the left chin..  Requesting prescription for Valtrex.  Patient has a history of similar outbreaks in the past.  No other significant symptoms.  HPI   Prior to Admission medications   Medication Sig Start Date End Date Taking? Authorizing Provider  cyclobenzaprine (FLEXERIL) 5 MG tablet 1 pill by mouth up to every 8 hours as needed. Start with one pill by mouth each bedtime as needed due to sedation Patient not taking: Reported on 07/05/2018 04/30/18   Shade Flood, MD  ibuprofen (ADVIL,MOTRIN) 800 MG tablet Take 1 tablet (800 mg total) by mouth every 8 (eight) hours as needed. Patient not taking: Reported on 04/30/2018 03/30/17   Deatra Canter, FNP  meloxicam (MOBIC) 7.5 MG tablet Take 1 tablet (7.5 mg total) by mouth daily as needed for pain. Patient not taking: Reported on 07/05/2018 04/30/18   Shade Flood, MD    Allergies  Allergen Reactions  . Morphine And Related     Patient Active Problem List   Diagnosis Date Noted  . 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 Edema of left ankle 09/16/2017  . Concern about STD in male without diagnosis 05/15/2017  . Worries 05/15/2017  . Need for diphtheria-tetanus-pertussis (Tdap) vaccine 05/15/2017  . Injury of left ankle 04/08/2017    Past Medical History:  Diagnosis Date  . Herpes     No past surgical history on file.  Social History   Socioeconomic History  . Marital status: Single    Spouse name: Not on file  . Number of children: Not on file  . Years of education: Not on file  . Highest education level: Not on file  Occupational History  . Not on file  Social Needs  . Financial resource strain: Not on file  . Food insecurity:    Worry: Not on file    Inability: Not on  file  . Transportation needs:    Medical: Not on file    Non-medical: Not on file  Tobacco Use  . Smoking status: Never Smoker  . Smokeless tobacco: Never Used  Substance and Sexual Activity  . Alcohol use: No  . Drug use: No  . Sexual activity: Not on file  Lifestyle  . Physical activity:    Days per week: Not on file    Minutes per session: Not on file  . Stress: Not on file  Relationships  . Social connections:    Talks on phone: Not on file    Gets together: Not on file    Attends religious service: Not on file    Active member of club or organization: Not on file    Attends meetings of clubs or organizations: Not on file    Relationship status: Not on file  . Intimate partner violence:    Fear of current or ex partner: Not on file    Emotionally abused: Not on file    Physically abused: Not on file    Forced sexual activity: Not on file  Other Topics Concern  . Not on file  Social History Narrative  . Not on file    Family History  Problem Relation Age of Onset  . Healthy Mother   . Diabetes Father   .  Hypertension Father   . Healthy Brother      Review of Systems  Constitutional: Negative.  Negative for chills and fever.  HENT: Negative for sore throat.   Eyes: Negative for discharge and redness.  Respiratory: Negative for cough and shortness of breath.   Cardiovascular: Negative for chest pain and palpitations.  Gastrointestinal: Negative for abdominal pain, diarrhea, nausea and vomiting.  Genitourinary: Negative for dysuria.  Skin: Positive for rash.  Neurological: Negative.  Negative for dizziness and headaches.  Endo/Heme/Allergies: Negative.    Vitals:   07/05/18 1637  BP: 113/70  Pulse: (!) 59  Resp: 16  Temp: 98.7 F (37.1 C)  SpO2: 97%     Physical Exam  Constitutional: He is oriented to person, place, and time. He appears well-developed and well-nourished.  HENT:  Head: Normocephalic and atraumatic.  Eyes: Pupils are equal, round,  and reactive to light. EOM are normal.  Neck: Normal range of motion. Neck supple.  Cardiovascular: Normal rate.  Pulmonary/Chest: Effort normal.  Musculoskeletal: Normal range of motion.  Neurological: He is alert and oriented to person, place, and time.  Skin: Rash noted.  Psychiatric: He has a normal mood and affect. His behavior is normal.  Vitals reviewed.      ASSESSMENT & PLAN: Juda was seen today for medication refill.  Diagnoses and all orders for this visit:  Herpes dermatitis -     valACYclovir (VALTREX) 1000 MG tablet; Take 1 tablet (1,000 mg total) by mouth 2 (two) times daily.    Patient Instructions       If you have lab work done today you will be contacted with your lab results within the next 2 weeks.  If you have not heard from us then please contact us. The fastest way to get your results is to register for My Chart.   IF you received an x-ray today, you will receive an invoice from [facility]. Please contact [facility] at [phone] with questions or concerns regarding your invoice.   IF you received labwork today, you will receive an invoice from [facility]. Please contact [facility] at [phone] with questions or concerns regarding your invoice.   Our billing staff will not be able to assist you with questions regarding bills from these companies.  You will be contacted with the lab results as soon as they are available. The fastest way to get your results is to activate your My Chart account. Instructions are located on the last page of this paperwork. If you have not heard from us regarding the results in 2 weeks, please contact this office.     Cold Sore A cold sore, also called a fever blister, is a skin infection that is caused by a virus. This infection causes small, fluid-filled sores to form inside of the mouth or on the lips, gums, nose, chin, or cheeks. Cold sores can spread to other parts of the body, such as the  eyes or fingers. Cold sores can be spread or passed from person to person (contagious) until the sores crust over completely. Cold sores can be spread through close contact, such as kissing or sharing a drinking glass. Follow these instructions at home: Medicines  Take or apply over-the-counter and prescription medicines only as told by your doctor.  Use a cotton-tip swab to apply creams or gels to your sores. Sore Care  Do not touch the sores or pick the scabs.  Wash your hands often. Do not touch your eyes without washing your hands  first.  Keep the sores clean and dry.  If directed, apply ice to the sores:  Put ice in a plastic bag.  Place a towel between your skin and the bag.  Leave the ice on for 20 minutes, 2-3 times per day. Lifestyle  Do not kiss, have oral sex, or share personal items until your sores heal.  Eat a soft, bland diet. Avoid eating hot, cold, or salty foods. These can hurt your mouth.  Use a straw if it hurts to drink out of a glass.  Avoid the sun and limit your stress if these things trigger outbreaks. If sun causes cold sores, apply sunscreen on your lips before being out in the sun. Contact a doctor if:  You have symptoms for more than two weeks.  You have pus coming from the sores.  You have redness that is spreading.  You have pain or irritation in your eye.  You get sores on your genitals.  Your sores do not heal within two weeks.  You get cold sores often. Get help right away if:  You have a fever and your symptoms suddenly get worse.  You have a headache and confusion. This information is not intended to replace advice given to you by your health care provider. Make sure you discuss any questions you have with your health care provider. Document Released: 02/03/2012 Document Revised: 01/10/2016 Document Reviewed: 05/25/2015 Elsevier Interactive Patient Education  2018 Elsevier Inc.      Edwina Barth, MD [facility]

## 2018-10-02 ENCOUNTER — Other Ambulatory Visit: Payer: Self-pay

## 2018-10-02 ENCOUNTER — Ambulatory Visit (INDEPENDENT_AMBULATORY_CARE_PROVIDER_SITE_OTHER): Payer: BLUE CROSS/BLUE SHIELD | Admitting: Osteopathic Medicine

## 2018-10-02 ENCOUNTER — Encounter: Payer: Self-pay | Admitting: Osteopathic Medicine

## 2018-10-02 ENCOUNTER — Emergency Department (HOSPITAL_COMMUNITY): Payer: BLUE CROSS/BLUE SHIELD

## 2018-10-02 ENCOUNTER — Emergency Department (HOSPITAL_COMMUNITY)
Admission: EM | Admit: 2018-10-02 | Discharge: 2018-10-02 | Disposition: A | Discharge status: Home / Self Care | Payer: BLUE CROSS/BLUE SHIELD | Attending: Emergency Medicine | Admitting: Emergency Medicine

## 2018-10-02 ENCOUNTER — Encounter (HOSPITAL_COMMUNITY): Payer: Self-pay

## 2018-10-02 VITALS — BP 120/80 | HR 78 | Temp 98.1°F | Resp 20 | Ht 67.13 in | Wt 167.4 lb

## 2018-10-02 DIAGNOSIS — Z87891 Personal history of nicotine dependence: Secondary | ICD-10-CM | POA: Insufficient documentation

## 2018-10-02 DIAGNOSIS — Z79899 Other long term (current) drug therapy: Secondary | ICD-10-CM | POA: Diagnosis not present

## 2018-10-02 DIAGNOSIS — J029 Acute pharyngitis, unspecified: Secondary | ICD-10-CM

## 2018-10-02 DIAGNOSIS — J039 Acute tonsillitis, unspecified: Secondary | ICD-10-CM | POA: Insufficient documentation

## 2018-10-02 DIAGNOSIS — R070 Pain in throat: Secondary | ICD-10-CM | POA: Diagnosis present

## 2018-10-02 DIAGNOSIS — J391 Other abscess of pharynx: Secondary | ICD-10-CM | POA: Diagnosis not present

## 2018-10-02 LAB — CBC WITH DIFFERENTIAL/PLATELET
ABS IMMATURE GRANULOCYTES: 0.09 10*3/uL — AB (ref 0.00–0.07)
Basophils Absolute: 0.1 10*3/uL (ref 0.0–0.1)
Basophils Relative: 0 %
EOS PCT: 0 %
Eosinophils Absolute: 0 10*3/uL (ref 0.0–0.5)
HEMATOCRIT: 42.2 % (ref 39.0–52.0)
HEMOGLOBIN: 14.3 g/dL (ref 13.0–17.0)
Immature Granulocytes: 1 %
LYMPHS ABS: 1.3 10*3/uL (ref 0.7–4.0)
LYMPHS PCT: 7 %
MCH: 28.9 pg (ref 26.0–34.0)
MCHC: 33.9 g/dL (ref 30.0–36.0)
MCV: 85.3 fL (ref 80.0–100.0)
Monocytes Absolute: 1.6 10*3/uL — ABNORMAL HIGH (ref 0.1–1.0)
Monocytes Relative: 8 %
NEUTROS ABS: 16.7 10*3/uL — AB (ref 1.7–7.7)
Neutrophils Relative %: 84 %
Platelets: 241 10*3/uL (ref 150–400)
RBC: 4.95 MIL/uL (ref 4.22–5.81)
RDW: 12.8 % (ref 11.5–15.5)
WBC: 19.8 10*3/uL — ABNORMAL HIGH (ref 4.0–10.5)
nRBC: 0 % (ref 0.0–0.2)

## 2018-10-02 LAB — POCT RAPID STREP A (OFFICE): Rapid Strep A Screen: NEGATIVE

## 2018-10-02 LAB — BASIC METABOLIC PANEL
Anion gap: 9 (ref 5–15)
BUN: 11 mg/dL (ref 6–20)
CHLORIDE: 107 mmol/L (ref 98–111)
CO2: 25 mmol/L (ref 22–32)
Calcium: 9.1 mg/dL (ref 8.9–10.3)
Creatinine, Ser: 1.19 mg/dL (ref 0.61–1.24)
GFR calc Af Amer: >60 mL/min (ref >60)
GLUCOSE: 81 mg/dL (ref 70–99)
POTASSIUM: 4.4 mmol/L (ref 3.5–5.1)
SODIUM: 141 mmol/L (ref 135–145)

## 2018-10-02 MED ORDER — CLINDAMYCIN HCL 300 MG PO CAPS: 300.0000 mg | ORAL_CAPSULE | Freq: Four times a day (QID) | ORAL | 0 refills | Status: AC

## 2018-10-02 MED ORDER — IOHEXOL 300 MG/ML  SOLN
75.0000 mL | Freq: Once | INTRAMUSCULAR | Status: AC | PRN
  Administered 2018-10-02: 75 mL via INTRAVENOUS

## 2018-10-02 MED ORDER — CLINDAMYCIN PHOSPHATE 900 MG/50ML IV SOLN
900.0000 mg | Freq: Once | INTRAVENOUS | Status: AC
  Administered 2018-10-02: 900 mg via INTRAVENOUS
  Filled 2018-10-02: qty 50

## 2018-10-02 MED ORDER — KETOROLAC TROMETHAMINE 60 MG/2ML IM SOLN
60.0000 mg | Freq: Once | INTRAMUSCULAR | Status: AC
  Administered 2018-10-02: 60 mg via INTRAMUSCULAR

## 2018-10-02 MED ORDER — DEXAMETHASONE SODIUM PHOSPHATE 10 MG/ML IJ SOLN
10.0000 mg | Freq: Once | INTRAMUSCULAR | Status: AC
  Administered 2018-10-02: 10 mg via INTRAVENOUS
  Filled 2018-10-02: qty 1

## 2018-10-02 MED ORDER — HYDROCODONE-ACETAMINOPHEN 7.5-325 MG/15ML PO SOLN: 15.0000 mL | Freq: Four times a day (QID) | ORAL | 0 refills | Status: AC | PRN

## 2018-10-02 MED ORDER — FENTANYL CITRATE (PF) 100 MCG/2ML IJ SOLN
50.0000 ug | Freq: Once | INTRAMUSCULAR | Status: AC
  Administered 2018-10-02: 50 ug via INTRAVENOUS
  Filled 2018-10-02: qty 2

## 2018-10-02 MED ORDER — SODIUM CHLORIDE (PF) 0.9 % IJ SOLN
INTRAMUSCULAR | Status: AC
  Filled 2018-10-02: qty 50

## 2018-10-02 MED ORDER — SODIUM CHLORIDE 0.9 % IV BOLUS
1000.0000 mL | Freq: Once | INTRAVENOUS | Status: AC
  Administered 2018-10-02: 1000 mL via INTRAVENOUS

## 2018-10-02 MED ORDER — ONDANSETRON HCL 4 MG/2ML IJ SOLN
4.0000 mg | Freq: Once | INTRAMUSCULAR | Status: AC
  Administered 2018-10-02: 4 mg via INTRAVENOUS
  Filled 2018-10-02: qty 2

## 2018-10-02 NOTE — ED Provider Notes (Signed)
[facility]-EMERGENCY DEPT Provider Note   CSN: [id number] Arrival date & time: 10/02/18  1024     History   Chief Complaint Chief Complaint  Patient presents with  . tonsillar abscess    HPI Clarence Carr is a 28 y.o. male.  Patient is a 28 year old male with no significant past medical history presenting today with 5-day history of worsening throat pain.  He has not had cough or congestion.  He is now to the point where his throat hurts so badly he is unable to swallow and spits out his saliva.  He was able to eat some soup yesterday around 10:00 but that was the last time he had anything.  He denies fever or vomiting.  He has no chest or abdominal pain.  He takes no medications regularly.  Patient was seen at urgent care at that time had a temperature of 99.5.  He was given a dose of Toradol and sent here for further evaluation.  The history is provided by the patient.    Past Medical History:  Diagnosis Date  . Herpes     Patient Active Problem List   Diagnosis Date Noted  . 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 Edema of left ankle 09/16/2017  . Concern about STD in male without diagnosis 05/15/2017  . Worries 05/15/2017  . Need for diphtheria-tetanus-pertussis (Tdap) vaccine 05/15/2017  . Injury of left ankle 04/08/2017    History reviewed. No pertinent surgical history.      Home Medications    Prior to Admission medications   Medication Sig Start Date End Date Taking? Authorizing Provider  valACYclovir (VALTREX) 1000 MG tablet Take 1 tablet (1,000 mg total) by mouth 2 (two) times daily. 07/05/18   Georgina Quint, MD    Family History Family History  Problem Relation Age of Onset  . Healthy Mother   . Diabetes Father   . Hypertension Father   . Healthy Brother     Social History Social History   Tobacco Use  . Smoking status: Former Smoker    Types: Cigarettes    Last attempt to  quit: 07/27/2018    Years since quitting: 0.1  . Smokeless tobacco: Never Used  Substance Use Topics  . Alcohol use: No  . Drug use: No     Allergies   Morphine and related   Review of Systems Review of Systems  All other systems reviewed and are negative.    Physical Exam Updated Vital Signs BP 119/83 (BP Location: Left Arm)   Pulse 71   Temp 98.8 F (37.1 C) (Oral)   Resp 16   Ht 5' 7.13" (1.705 m)   Wt 75.9 kg   SpO2 100%   BMI 26.12 kg/m   Physical Exam Vitals signs and nursing note reviewed.  Constitutional:      General: He is not in acute distress.    Appearance: He is well-developed.  HENT:     Head: Normocephalic and atraumatic.     Right Ear: Tympanic membrane normal.     Left Ear: Tympanic membrane normal.     Nose: Nose normal.     Mouth/Throat:     Mouth: Mucous membranes are dry. No oral lesions or angioedema.     Tongue: No lesions.     Tonsils: No tonsillar exudate.   Eyes:     Conjunctiva/sclera: Conjunctivae normal.     Pupils: Pupils are equal, round, and reactive to light.  Neck:     Musculoskeletal: Normal range of motion and neck supple.  Cardiovascular:     Rate and Rhythm: Normal rate and regular rhythm.     Heart sounds: No murmur.  Pulmonary:     Effort: Pulmonary effort is normal. No respiratory distress.     Breath sounds: Normal breath sounds. No stridor. No wheezing or rales.  Musculoskeletal: Normal range of motion.        General: No tenderness.  Lymphadenopathy:     Cervical: Cervical adenopathy present.  Skin:    General: Skin is warm and dry.     Findings: No erythema or rash.  Neurological:     General: No focal deficit present.     Mental Status: He is alert and oriented to person, place, and time. Mental status is at baseline.  Psychiatric:        Behavior: Behavior normal.      ED Treatments / Results  Labs (all labs ordered are listed, but only abnormal results are displayed) Labs Reviewed  CBC WITH  DIFFERENTIAL/PLATELET - Abnormal; Notable for the following components:      Result Value   WBC 19.8 (*)    Neutro Abs 16.7 (*)    Monocytes Absolute 1.6 (*)    Abs Immature Granulocytes 0.09 (*)    All other components within normal limits  BASIC METABOLIC PANEL    EKG None  Radiology Ct Soft Tissue Neck W Contrast  Result Date: 10/02/2018 CLINICAL DATA:  Sore throat. Tonsillar swelling. Assess for abscess. EXAM: CT NECK WITH CONTRAST TECHNIQUE: Multidetector CT imaging of the neck was performed using the standard protocol following the bolus administration of intravenous contrast. CONTRAST:  14mL OMNIPAQUE IOHEXOL 300 MG/ML  SOLN COMPARISON:  None. FINDINGS: Pharynx and larynx: There is right tonsillar swelling. There is indistinct low density of the right tonsil consistent with phlegm a tori change or perhaps early phlegmonous change. There is no discrete marginated abscess. There is mild edema in the parapharyngeal space on the right. Salivary glands: Parotid and submandibular glands are normal. Thyroid: Normal Lymph nodes: Mild reactive prominence of the right level 2 lymph nodes. Vascular: Normal Limited intracranial: Normal Visualized orbits: Normal. Mastoids and visualized paranasal sinuses: Insignificant left maxillary sinus retention cysts. Skeleton: Normal Upper chest: Normal Other: None IMPRESSION: Tonsillitis on the right with tonsillar enlargement and edema. This could represent early phlegmonous inflammation, but there is no discrete marginated low-density region to suggest drainable abscess. Mild edema the right parapharyngeal space without focal collection. Mild reactive level 2 nodal prominence. Electronically Signed   By: Paulina Fusi M.D.   On: 10/02/2018 12:53    Procedures Procedures (including critical care time)  Medications Ordered in ED Medications  dexamethasone (DECADRON) injection 10 mg (has no administration in time range)  fentaNYL (SUBLIMAZE) injection 50 mcg  (has no administration in time range)  ondansetron (ZOFRAN) injection 4 mg (has no administration in time range)  sodium chloride 0.9 % bolus 1,000 mL (has no administration in time range)     Initial Impression / Assessment and Plan / ED Course  I have reviewed the triage vital signs and the nursing notes.  Pertinent labs & imaging results that were available during my care of the patient were reviewed by me and considered in my medical decision making (see chart for details).     Patient presenting today from urgent care for further evaluation of throat pain.  Patient states the pain is worsened over the last 5  days.  He is currently spitting his saliva in an emesis basin because he states it is too painful to swallow.  He is in no respiratory distress and has no stridor at this time.  Patient does have some fullness in the right peritonsillar area but no uvula deviation.  He has no evidence of dental or ear pathology.  He does have mild muffling of his voice.  Patient was strep negative at urgent care and had a temperature of 99.5.  Patient was given Toradol at urgent care and states it did help with the pain some but he still having significant pain with swallowing. CBC, BMP, CT soft tissue neck.  They are patient given IV fluids, Decadron, pain control.  1:26 PM Patient's labs show a leukocytosis of 19,000 and normal BMP.  CT is consistent with tonsillitis on the right with enlargement and edema but no discrete drainable abscess at this time.  Also some mild edema to the right peripharyngeal space without focal collection.  On reevaluation patient is feeling better and requesting something to drink.  Will discharge home on clindamycin and pain medication.  Given ENT follow-up and strict return precautions.  Final Clinical Impressions(s) / ED Diagnoses   Final diagnoses:  Tonsillitis    ED Discharge Orders         Ordered    clindamycin (CLEOCIN) 300 MG capsule  Every 6 hours      10/02/18 1331    HYDROcodone-acetaminophen (HYCET) 7.5-325 mg/15 ml solution  4 times daily PRN     10/02/18 1331           Gwyneth SproutPlunkett, Jacquelina Hewins, MD 10/02/18 1332

## 2018-10-02 NOTE — Discharge Instructions (Signed)
Make sure you change her toothbrush in a few days.  In addition to the pain medication if you need additional pain control you can also use Motrin.

## 2018-10-02 NOTE — ED Notes (Signed)
Patient transported to CT 

## 2018-10-02 NOTE — Patient Instructions (Addendum)
  I have called [facility] emergency department to let them know you will be coming.  I feel that urgent evaluation with a CT scan of the neck and possible consultation with an ear nose and throat (ENT) specialist is warranted in case you have an abscess in the tonsil/neck that is causing your illness. Please proceed directly to the emergency room, they are expecting you!    If you have lab work done today you will be contacted with your lab results within the next 2 weeks.  If you have not heard from [place] then please contact us. The fastest way to get your results is to register for My Chart.   IF you received an x-ray today, you will receive an invoice from [facility]. Please contact [facility] at [phone] with questions or concerns regarding your invoice.   IF you received labwork today, you will receive an invoice from [facility]. Please contact [facility] at [phone] with questions or concerns regarding your invoice.   Our billing staff will not be able to assist you with questions regarding bills from these companies.  You will be contacted with the lab results as soon as they are available. The fastest way to get your results is to activate your My Chart account. Instructions are located on the last page of this paperwork. If you have not heard from [place] regarding the results in 2 weeks, please contact this office.

## 2018-10-02 NOTE — Progress Notes (Signed)
HPI: Clarence [facility] is a 28 y.o. male who  has a past medical history of Herpes.  he presents to [facility] today, 10/02/18,  for chief complaint of: Sick - throat  . Location: R face and throat  . Quality: severely sore, cannot swallow, drooling/mucus started today  . Duration: 4 days  . Timing: constant . Assoc signs/symptoms: fatigue, headache, hurts to open mouth       Past medical, surgical, social and family history reviewed:  Patient Active Problem List   Diagnosis Date Noted  . 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 Edema of left ankle 09/16/2017  . Concern about STD in male without diagnosis 05/15/2017  . Worries 05/15/2017  . Need for diphtheria-tetanus-pertussis (Tdap) vaccine 05/15/2017  . Injury of left ankle 04/08/2017    History reviewed. No pertinent surgical history.  Social History   Tobacco Use  . Smoking status: Former Smoker    Types: Cigarettes    Last attempt to quit: 07/27/2018    Years since quitting: 0.1  . Smokeless tobacco: Never Used  Substance Use Topics  . Alcohol use: No    Family History  Problem Relation Age of Onset  . Healthy Mother   . Diabetes Father   . Hypertension Father   . Healthy Brother      Current medication list and allergy/intolerance information reviewed:    Current Outpatient Medications  Medication Sig Dispense Refill  . valACYclovir (VALTREX) 1000 MG tablet Take 1 tablet (1,000 mg total) by mouth 2 (two) times daily. 30 tablet 5   Current Facility-Administered Medications  Medication Dose Route Frequency Provider Last Rate Last Dose  . ketorolac (TORADOL) injection 60 mg  60 mg Intramuscular Once Cla[facility] Nielsen, DO        Allergies  Allergen Reactions  . Morphine And Related       Review of Systems:  Constitutional:  No  fever, no chills, +recent illness, No unintentional weight changes. +significant fatigue.   HEENT: +headache,  no vision change, no hearing change, +sore throat, No  sinus pressure  Cardiac: No  chest pain, No  pressure, No palpitations  Respiratory:  +shortness of breath. +Cough  Gastrointestinal: No  abdominal pain  Musculoskeletal: No new myalgia/arthralgia  Skin: No  Rash   Exam:  BP 117/73   Pulse 97   Temp 99.5 F (37.5 C) (Oral)   Resp 20   Ht 5' 7.13" (1.705 m)   Wt 167 lb 6.4 oz (75.9 kg)   SpO2 97%   BMI 26.12 kg/m   Constitutional: VS see above. General Appearance: alert, well-developed, well-nourished, mild distress d/t pain  Eyes: Normal lids and conjunctive, non-icteric sclera  Ears, Nose, Mouth, Throat: MMM, Normal external inspection ears/nares/mouth/lips/gums. TM normal bilaterally. Pharynx/tonsils +erythema and some bulging on the R tonsil, no exudate. Nasal mucosa normal.   Neck: No masses, trachea midline. No tenderness/mass appreciated. +lymphadenopathy on R side is exquisitely tender, +trismus  Respiratory: Normal respiratory effort. no wheeze, no rhonchi, no rales  Cardiovascular: S1/S2 normal, no murmur, no rub/gallop auscultated. RRR. No lower extremity edema.   Gastrointestinal: Nontender  Musculoskeletal: Gait normal.   Neurological: Normal balance/coordination. No tremor.   Skin: warm, dry, intact.   Psychiatric: Normal judgment/insight. Normal mood and affect.  Results for orders placed or performed in visit on 10/02/18 (from the past 24 hour(s))  POCT rapid strep A     Status: None   Collection Time: 10/02/18  9:04 AM  Result Value Ref Range   Rapid Strep A Screen Negative Negative        ASSESSMENT/PLAN:   Pharyngeal abscess - Plan: ketorolac (TORADOL) injection 60 mg  Sore throat - Plan: POCT rapid strep A, ketorolac (TORADOL) injection 60 mg   Severe sore throat, swelling on R face/neck and tonsils, trismus and drooling, concern for need for urgent evaluation for pharyngeal abscess. Called [facility] to inform them of patient  impending arrival by private vehicle. Toradol shot given in office.        Patient Instructions    I have called [facility] to let them know you will be coming.  I feel that urgent evaluation with a CT scan of the neck and possible consultation with an ear nose and throat (ENT) specialist is warranted in case you have an abscess in the tonsil/neck that is causing your illness. Please proceed directly to the emergency room, they are expecting you!    If you have lab work done today you will be contacted with your lab results within the next 2 weeks.  If you have not heard from [place] then please contact us. The fastest way to get your results is to register for [facility].   IF you received an x-ray today, you will receive an invoice from [facility]. Please contact [facility] at [phone] with questions or concerns regarding your invoice.   IF you received labwork today, you will receive an invoice from [facility]. Please contact [facility] at [phone] with questions or concerns regarding your invoice.   Our billing staff will not be able to assist you with questions regarding bills from these companies.  You will be contacted with the lab results as soon as they are available. The fastest way to get your results is to activate your [facility] account. Instructions are located on the last page of this paperwork. If you have not heard from [place] regarding the results in 2 weeks, please contact this office.        Visit summary with medication list and pertinent instructions was printed for patient to review. All questions at time of visit were answered - patient instructed to contact office with any additional concerns. ER/RTC precautions were reviewed with the patient.   Follow-up plan: Return for follow-up with PCP as directed, follow-up if needed after ER dicsharge .    Please note: voice recognition software was used to produce this document,  and typos may escape review. Please contact [facility] for any needed clarifications.

## 2018-10-02 NOTE — ED Triage Notes (Signed)
Patient was at [facility] at [facility] today and was sent to the ED for possible tonsillar abscess. Patient' symptoms started x 4 days ago.

## 2018-10-05 ENCOUNTER — Encounter (HOSPITAL_COMMUNITY): Payer: Self-pay | Admitting: *Deleted

## 2018-10-05 ENCOUNTER — Ambulatory Visit (HOSPITAL_COMMUNITY): Payer: BLUE CROSS/BLUE SHIELD | Admitting: Anesthesiology

## 2018-10-05 ENCOUNTER — Other Ambulatory Visit: Payer: Self-pay

## 2018-10-05 NOTE — Progress Notes (Signed)
Spoke with pt's mother, [facility] for pre-op call. Pt answered the phone but handed it to him mother. She states patient does not have a cardiac history and is not diabetic.

## 2018-10-05 NOTE — Anesthesia Preprocedure Evaluation (Deleted)
Anesthesia Evaluation    Reviewed: Allergy & Precautions, Patient's Chart, lab work & pertinent test results  Airway        Dental   Pulmonary neg pulmonary ROS, former smoker,           Cardiovascular negative cardio ROS       Neuro/Psych negative neurological ROS  negative psych ROS   GI/Hepatic negative GI ROS, Neg liver ROS,   Endo/Other  negative endocrine ROS  Renal/GU negative Renal ROS  negative genitourinary   Musculoskeletal negative musculoskeletal ROS (+)   Abdominal   Peds  Hematology negative hematology ROS (+)   Anesthesia Other Findings Peritonsillar abscess  Reproductive/Obstetrics                             Anesthesia Physical Anesthesia Plan  ASA: I  Anesthesia Plan: General   Post-op Pain Management:    Induction: Intravenous  PONV Risk Score and Plan: 2 and Ondansetron, Dexamethasone and Midazolam  Airway Management Planned: Oral ETT  Additional Equipment:   Intra-op Plan:   Post-operative Plan: Extubation in OR  Informed Consent: I have reviewed the patients History and Physical, chart, labs and discussed the procedure including the risks, benefits and alternatives for the proposed anesthesia with the patient or authorized representative who has indicated his/her understanding and acceptance.     Dental advisory given  Plan Discussed with: CRNA  Anesthesia Plan Comments:         Anesthesia Quick Evaluation

## 2018-10-06 ENCOUNTER — Ambulatory Visit (HOSPITAL_COMMUNITY)
Admission: RE | Admit: 2018-10-06 | Discharge: 2018-10-06 | Disposition: A | Discharge status: Home / Self Care | Payer: BLUE CROSS/BLUE SHIELD | Attending: Otolaryngology | Admitting: Otolaryngology

## 2018-10-06 ENCOUNTER — Encounter (HOSPITAL_COMMUNITY): Payer: Self-pay

## 2018-10-06 ENCOUNTER — Other Ambulatory Visit: Payer: Self-pay | Admitting: Otolaryngology

## 2018-10-06 ENCOUNTER — Encounter (HOSPITAL_COMMUNITY)
Admission: RE | Disposition: A | Discharge status: Home / Self Care | Payer: Self-pay | Source: Home / Self Care | Attending: Otolaryngology

## 2018-10-06 DIAGNOSIS — Z538 Procedure and treatment not carried out for other reasons: Secondary | ICD-10-CM | POA: Diagnosis not present

## 2018-10-06 DIAGNOSIS — J36 Peritonsillar abscess: Secondary | ICD-10-CM | POA: Insufficient documentation

## 2018-10-06 HISTORY — PX: INCISION AND DRAINAGE OF PERITONSILLAR ABCESS: SHX6257

## 2018-10-06 HISTORY — DX: Headache, unspecified: R51.9

## 2018-10-06 HISTORY — DX: Headache: R51

## 2018-10-06 SURGERY — INCISION AND DRAINAGE, ABSCESS, PERITONSILLAR
Anesthesia: General

## 2018-10-06 MED ORDER — PROPOFOL 10 MG/ML IV BOLUS
INTRAVENOUS | Status: AC
  Filled 2018-10-06: qty 40

## 2018-10-06 MED ORDER — FENTANYL CITRATE (PF) 250 MCG/5ML IJ SOLN
INTRAMUSCULAR | Status: AC
  Filled 2018-10-06: qty 5

## 2018-10-06 MED ORDER — MIDAZOLAM HCL 2 MG/2ML IJ SOLN
INTRAMUSCULAR | Status: AC
  Filled 2018-10-06: qty 2

## 2018-10-06 SURGICAL SUPPLY — 33 items
CANISTER SUCT 3000ML PPV (MISCELLANEOUS) IMPLANT
CATH ROBINSON RED A/P 10FR (CATHETERS) IMPLANT
CLEANER TIP ELECTROSURG 2X2 (MISCELLANEOUS) IMPLANT
COAGULATOR SUCT SWTCH 10FR 6 (ELECTROSURGICAL) IMPLANT
COVER WAND RF STERILE (DRAPES) IMPLANT
CRADLE DONUT ADULT HEAD (MISCELLANEOUS) IMPLANT
DRAPE HALF SHEET 40X57 (DRAPES) IMPLANT
ELECT COATED BLADE 2.86 ST (ELECTRODE) IMPLANT
ELECT REM PT RETURN 9FT ADLT (ELECTROSURGICAL)
ELECT REM PT RETURN 9FT PED (ELECTROSURGICAL)
ELECTRODE REM PT RETRN 9FT PED (ELECTROSURGICAL) IMPLANT
ELECTRODE REM PT RTRN 9FT ADLT (ELECTROSURGICAL) IMPLANT
GAUZE 4X4 16PLY RFD (DISPOSABLE) IMPLANT
GLOVE SS BIOGEL STRL SZ 7.5 (GLOVE) IMPLANT
GLOVE SUPERSENSE BIOGEL SZ 7.5 (GLOVE)
GOWN STRL REUS W/ TWL LRG LVL3 (GOWN DISPOSABLE) IMPLANT
GOWN STRL REUS W/TWL LRG LVL3 (GOWN DISPOSABLE)
KIT BASIN OR (CUSTOM PROCEDURE TRAY) IMPLANT
KIT TURNOVER KIT B (KITS) IMPLANT
NEEDLE HYPO 25GX1X1/2 BEV (NEEDLE) IMPLANT
NS IRRIG 1000ML POUR BTL (IV SOLUTION) IMPLANT
PACK SURGICAL SETUP 50X90 (CUSTOM PROCEDURE TRAY) IMPLANT
PAD ARMBOARD 7.5X6 YLW CONV (MISCELLANEOUS) IMPLANT
PENCIL FOOT CONTROL (ELECTRODE) IMPLANT
SPECIMEN JAR SMALL (MISCELLANEOUS) IMPLANT
SPONGE TONSIL TAPE 1 RFD (DISPOSABLE) IMPLANT
SYR BULB 3OZ (MISCELLANEOUS) IMPLANT
TOWEL OR 17X24 6PK STRL BLUE (TOWEL DISPOSABLE) IMPLANT
TUBE CONNECTING 12X1/4 (SUCTIONS) IMPLANT
TUBE SALEM SUMP 10F W/ARV (TUBING) IMPLANT
TUBE SALEM SUMP 12R W/ARV (TUBING) IMPLANT
TUBE SALEM SUMP 16 FR W/ARV (TUBING) IMPLANT
WATER STERILE IRR 1000ML POUR (IV SOLUTION) IMPLANT

## 2018-10-06 NOTE — H&P (Signed)
  Patient feels 100% better.  This morning he had a spontaneous blood and pus from his mouth.  He was having extreme pain up until this point.  He now feels great and is able to talk.  He can open his mouth.  He feels like he can swallow.  The tonsil does look substantially reduced in size on the right side.  There is no significant erythema.  He is opening his mouth well.  He smiling and has no distress.  It would appear he has had a spontaneous rupture of his right peritonsillar abscess.  Given this and given his clinical appearance he does not need an incision and drainage procedure.  He is discharged to follow-up in the office in [place] 1-2 weeks.  He will continue his clindamycin.

## 2018-10-07 ENCOUNTER — Encounter (HOSPITAL_COMMUNITY): Payer: Self-pay | Admitting: Otolaryngology

## 2018-11-24 ENCOUNTER — Telehealth: Payer: Self-pay | Admitting: Osteopathic Medicine

## 2018-11-24 NOTE — Telephone Encounter (Signed)
Copied from CRM [id number]. Topic: Medical Record Request - Patient ROI Request >> Nov 24, 2018 10:37 AM Jay Schlichter wrote: Patient Name/DOB/MRN #: [facility]  / Jun 13, 1991 / [id number] Requestor Name/Agency: patient  Call Back #: [phone] Information Requested: 05/01/19 visit and images. Pt needs copy of work note issued 05/01/19   Route to [employer] for [employer]. For all other clinics, route to the clinic's PEC Pool.

## 2018-11-24 NOTE — Telephone Encounter (Signed)
I have spoke with pt. He is coming to pick this up at 102 on 11/25/18.

## 2019-02-27 NOTE — Progress Notes (Signed)
COVID Hotel Screening performed. Temperature.    Kentrel Clevenger MSN, RN 

## 2019-03-01 ENCOUNTER — Other Ambulatory Visit: Payer: Self-pay

## 2019-03-01 ENCOUNTER — Other Ambulatory Visit (HOSPITAL_COMMUNITY)
Admission: RE | Admit: 2019-03-01 | Discharge: 2019-03-01 | Disposition: A | Discharge status: Home / Self Care | Payer: BC Managed Care – PPO | Source: Ambulatory Visit | Attending: Emergency Medicine | Admitting: Emergency Medicine

## 2019-03-01 ENCOUNTER — Ambulatory Visit (INDEPENDENT_AMBULATORY_CARE_PROVIDER_SITE_OTHER): Payer: BC Managed Care – PPO | Admitting: Emergency Medicine

## 2019-03-01 ENCOUNTER — Encounter: Payer: Self-pay | Admitting: Emergency Medicine

## 2019-03-01 VITALS — BP 116/74 | HR 64 | Temp 98.5°F | Resp 16 | Ht 67.0 in | Wt 189.0 lb

## 2019-03-01 DIAGNOSIS — Z113 Encounter for screening for infections with a predominantly sexual mode of transmission: Secondary | ICD-10-CM

## 2019-03-01 DIAGNOSIS — Z0001 Encounter for general adult medical examination with abnormal findings: Secondary | ICD-10-CM

## 2019-03-01 DIAGNOSIS — Z0000 Encounter for general adult medical examination without abnormal findings: Secondary | ICD-10-CM

## 2019-03-01 DIAGNOSIS — Z111 Encounter for screening for respiratory tuberculosis: Secondary | ICD-10-CM | POA: Diagnosis not present

## 2019-03-01 NOTE — Progress Notes (Signed)
  Tuberculosis Risk Questionnaire  1. No Were you born outside the [place] in one of the following parts of the world: [place], [place], [place], [place] America or Eastern [place]?    2. No Have you traveled outside the [place] and lived for more than one month in one of the following parts of the world: [place], [place], [place], [place] or [place]?    3. No Do you have a compromised immune system such as from any of the following conditions:HIV/AIDS, organ or bone marrow transplantation, diabetes, immunosuppressive medicines (e.g. Prednisone, Remicaide), leukemia, lymphoma, cancer of the head or neck, gastrectomy or jejunal bypass, end-stage renal disease (on dialysis), or silicosis?     4. Yes  Have you ever or do you plan on working in: a residential care center, a health care facility, a jail or prison or homeless shelter?    5. No Have you ever: injected illegal drugs, used crack cocaine, lived in a homeless shelter  or been in jail or prison?     6. No Have you ever been exposed to anyone with infectious tuberculosis?  7. No Have you ever had a BCG vaccine? (BCG is a vaccine for tuberculosis  (TB) used in OTHER countries, NOT in the [place]).  8. No Have you ever been advised by a health care provider NOT to have a TB skin test?  9. No Have you ever had a POSITIVE TB skin test?  IF SO, when? n/a  IF SO, were you treated with INH? n/a  IF SO, where? n/a  Tuberculosis Symptom Questionnaire  Do you currently have any of the following symptoms?  1. No Unexplained cough lasting more than 3 weeks?   2. No Unexplained fever lasting more than 3 weeks.   3. No Night Sweats (sweating that leaves the bedclothes and sheets wet)     4. No Shortness of Breath   5. No Chest Pain   6. No Unintentional weight loss    7. No Unexplained fatigue (very tired for no reason)    

## 2019-03-01 NOTE — Patient Instructions (Addendum)
   If you have lab work done today you will be contacted with your lab results within the next 2 weeks.  If you have not heard from us then please contact us. The fastest way to get your results is to register for My Chart.   IF you received an x-ray today, you will receive an invoice from [facility]. Please contact [facility] at [phone] with questions or concerns regarding your invoice.   IF you received labwork today, you will receive an invoice from [facility]. Please contact [facility] at [phone] with questions or concerns regarding your invoice.   Our billing staff will not be able to assist you with questions regarding bills from these companies.  You will be contacted with the lab results as soon as they are available. The fastest way to get your results is to activate your My Chart account. Instructions are located on the last page of this paperwork. If you have not heard from us regarding the results in 2 weeks, please contact this office.     Health Maintenance, Male Adopting a healthy lifestyle and getting preventive care are important in promoting health and wellness. Ask your health care provider about:  The right schedule for you to have regular tests and exams.  Things you can do on your own to prevent diseases and keep yourself healthy. What should I know about diet, weight, and exercise? Eat a healthy diet   Eat a diet that includes plenty of vegetables, fruits, low-fat dairy products, and lean protein.  Do not eat a lot of foods that are high in solid fats, added sugars, or sodium. Maintain a healthy weight Body mass index (BMI) is a measurement that can be used to identify possible weight problems. It estimates body fat based on height and weight. Your health care provider can help determine your BMI and help you achieve or maintain a healthy weight. Get regular exercise Get regular exercise. This is one of the most important things you  can do for your health. Most adults should:  Exercise for at least 150 minutes each week. The exercise should increase your heart rate and make you sweat (moderate-intensity exercise).  Do strengthening exercises at least twice a week. This is in addition to the moderate-intensity exercise.  Spend less time sitting. Even light physical activity can be beneficial. Watch cholesterol and blood lipids Have your blood tested for lipids and cholesterol at 27 years of age, then have this test every 5 years. You may need to have your cholesterol levels checked more often if:  Your lipid or cholesterol levels are high.  You are older than 27 years of age.  You are at high risk for heart disease. What should I know about cancer screening? Many types of cancers can be detected early and may often be prevented. Depending on your health history and family history, you may need to have cancer screening at various ages. This may include screening for:  Colorectal cancer.  Prostate cancer.  Skin cancer.  Lung cancer. What should I know about heart disease, diabetes, and high blood pressure? Blood pressure and heart disease  High blood pressure causes heart disease and increases the risk of stroke. This is more likely to develop in people who have high blood pressure readings, are of African descent, or are overweight.  Talk with your health care provider about your target blood pressure readings.  Have your blood pressure checked: ? Every 3-5 years if you are 18-39 years   of age. ? Every year if you are 40 years old or older.  If you are between the ages of 65 and 75 and are a current or former smoker, ask your health care provider if you should have a one-time screening for abdominal aortic aneurysm (AAA). Diabetes Have regular diabetes screenings. This checks your fasting blood sugar level. Have the screening done:  Once every three years after age 45 if you are at a normal weight and have  a low risk for diabetes.  More often and at a younger age if you are overweight or have a high risk for diabetes. What should I know about preventing infection? Hepatitis B If you have a higher risk for hepatitis B, you should be screened for this virus. Talk with your health care provider to find out if you are at risk for hepatitis B infection. Hepatitis C Blood testing is recommended for:  Everyone born from 1945 through 1965.  Anyone with known risk factors for hepatitis C. Sexually transmitted infections (STIs)  You should be screened each year for STIs, including gonorrhea and chlamydia, if: ? You are sexually active and are younger than 27 years of age. ? You are older than 27 years of age and your health care provider tells you that you are at risk for this type of infection. ? Your sexual activity has changed since you were last screened, and you are at increased risk for chlamydia or gonorrhea. Ask your health care provider if you are at risk.  Ask your health care provider about whether you are at high risk for HIV. Your health care provider may recommend a prescription medicine to help prevent HIV infection. If you choose to take medicine to prevent HIV, you should first get tested for HIV. You should then be tested every 3 months for as long as you are taking the medicine. Follow these instructions at home: Lifestyle  Do not use any products that contain nicotine or tobacco, such as cigarettes, e-cigarettes, and chewing tobacco. If you need help quitting, ask your health care provider.  Do not use street drugs.  Do not share needles.  Ask your health care provider for help if you need support or information about quitting drugs. Alcohol use  Do not drink alcohol if your health care provider tells you not to drink.  If you drink alcohol: ? Limit how much you have to 0-2 drinks a day. ? Be aware of how much alcohol is in your drink. In the [place] one drink equals one 12  oz bottle of beer (355 mL), one 5 oz glass of wine (148 mL), or one 1 oz glass of hard liquor (44 mL). General instructions  Schedule regular health, dental, and eye exams.  Stay current with your vaccines.  Tell your health care provider if: ? You often feel depressed. ? You have ever been abused or do not feel safe at home. Summary  Adopting a healthy lifestyle and getting preventive care are important in promoting health and wellness.  Follow your health care provider's instructions about healthy diet, exercising, and getting tested or screened for diseases.  Follow your health care provider's instructions on monitoring your cholesterol and blood pressure. This information is not intended to replace advice given to you by your health care provider. Make sure you discuss any questions you have with your health care provider. Document Released: 01/31/2008 Document Revised: 07/28/2018 Document Reviewed: 07/28/2018 Elsevier Patient Education  2020 [employer]  

## 2019-03-01 NOTE — Progress Notes (Signed)
Clarence Kluver Jr. 28 y.o.   Chief Complaint  Patient presents with  . Annual Exam    std testing/ also would like paperwork for work    HISTORY OF PRESENT ILLNESS: This is a 28 y.o. male here for annual exam and also health examination certificate [facility] [place]. Needs tuberculosis screening.  HPI   Prior to Admission medications   Medication Sig Start Date End Date Taking? Authorizing Provider  valACYclovir (VALTREX) 1000 MG tablet Take 1 tablet (1,000 mg total) by mouth 2 (two) times daily. 07/05/18  Yes Dontai Pember, Eilleen KempfMiguel Jose, MD  acetaminophen (TYLENOL) 500 MG tablet Take 1,000 mg by mouth every 6 (six) hours as needed for moderate pain.    [provider]  clindamycin (CLEOCIN) 300 MG capsule Take 1 capsule (300 mg total) by mouth every 6 (six) hours. Patient not taking: Reported on 03/01/2019 10/02/18   Gwyneth SproutPlunkett, Whitney, MD  diphenhydrAMINE (BENADRYL) 25 MG tablet Take 50 mg by mouth every 6 (six) hours as needed for sleep.    [provider]  HYDROcodone-acetaminophen (HYCET) 7.5-325 mg/15 ml solution Take 15 mLs by mouth 4 (four) times daily as needed for moderate pain. Patient not taking: Reported on 03/01/2019 10/02/18 10/02/19  Gwyneth SproutPlunkett, Whitney, MD    Allergies  Allergen Reactions  . Morphine And Related Itching    Patient Active Problem List   Diagnosis Date Noted  . History of herpes labialis 09/16/2017    Past Medical History:  Diagnosis Date  . Headache    migraines when he was younger  . Herpes     Past Surgical History:  Procedure Laterality Date  . INCISION AND DRAINAGE OF PERITONSILLAR ABCESS N/A 10/06/2018   Procedure: INCISION AND DRAINAGE OF PERITONSILLAR ABCESS;  Surgeon: Suzanna ObeyByers, John, MD;  [facility];  Service: ENT;  Laterality: N/A;  . NO PAST SURGERIES      Social History   Socioeconomic History  . Marital status: Single    Spouse name: Not on file  . Number of children: 0  . Years of education:  Not on file  . Highest education level: Not on file  Occupational History  . Not on file  Social Needs  . Financial resource strain: Not on file  . Food insecurity    Worry: Not on file    Inability: Not on file  . Transportation needs    Medical: Not on file    Non-medical: Not on file  Tobacco Use  . Smoking status: Former Smoker    Types: Cigarettes    Quit date: 07/27/2018    Years since quitting: 0.5  . Smokeless tobacco: Never Used  Substance and Sexual Activity  . Alcohol use: Yes    Comment: rare- socially  . Drug use: No  . Sexual activity: Yes  Lifestyle  . Physical activity    Days per week: Not on file    Minutes per session: Not on file  . Stress: Not on file  Relationships  . Social Musicianconnections    Talks on phone: Not on file    Gets together: Not on file    Attends religious service: Not on file    Active member of club or organization: Not on file    Attends meetings of clubs or organizations: Not on file    Relationship status: Not on file  . Intimate partner violence    Fear of current or ex partner: Not on file    Emotionally abused: Not on file  Physically abused: Not on file    Forced sexual activity: Not on file  Other Topics Concern  . Not on file  Social History Narrative  . Not on file    Family History  Problem Relation Age of Onset  . Healthy Mother   . Diabetes Father   . Hypertension Father   . Healthy Brother      Review of Systems  Constitutional: Negative.  Negative for chills, fever and weight loss.  HENT: Negative.  Negative for congestion and sore throat.   Eyes: Negative.   Respiratory: Negative.  Negative for cough and shortness of breath.   Cardiovascular: Negative.  Negative for chest pain and palpitations.  Gastrointestinal: Negative.  Negative for abdominal pain, diarrhea, heartburn, nausea and vomiting.  Genitourinary: Negative.  Negative for dysuria.  Skin: Negative.  Negative for rash.  Neurological:  Negative for dizziness and headaches.  Endo/Heme/Allergies: Negative.   All other systems reviewed and are negative.  Vitals:   03/01/19 1613  BP: 116/74  Pulse: 64  Resp: 16  Temp: 98.5 F (36.9 C)  SpO2: 100%     Physical Exam Vitals signs reviewed.  Constitutional:      Appearance: Normal appearance.  HENT:     Head: Normocephalic and atraumatic.     Mouth/Throat:     Mouth: Mucous membranes are moist.     Pharynx: Oropharynx is clear.  Eyes:     Extraocular Movements: Extraocular movements intact.     Conjunctiva/sclera: Conjunctivae normal.     Pupils: Pupils are equal, round, and reactive to light.  Neck:     Musculoskeletal: Normal range of motion and neck supple.  Cardiovascular:     Rate and Rhythm: Normal rate and regular rhythm.     Pulses: Normal pulses.     Heart sounds: Normal heart sounds.  Pulmonary:     Effort: Pulmonary effort is normal.     Breath sounds: Normal breath sounds.  Abdominal:     General: There is no distension.     Palpations: Abdomen is soft.     Tenderness: There is no abdominal tenderness.  Musculoskeletal: Normal range of motion.  Skin:    General: Skin is warm and dry.     Capillary Refill: Capillary refill takes less than 2 seconds.  Neurological:     General: No focal deficit present.     Mental Status: He is alert and oriented to person, place, and time.  Psychiatric:        Mood and Affect: Mood normal.        Behavior: Behavior normal.      ASSESSMENT & PLAN: Earsel was seen today for annual exam.  Diagnoses and all orders for this visit:  Routine general medical examination at a health care facility  Tuberculosis screening -     TB Skin Test  Screening examination for STD (sexually transmitted disease) -     GC/Chlamydia probe amp ([place]    Patient Instructions       If you have lab work done today you will be contacted with your lab results within the next 2 weeks.  If you  have not heard from us then please contact us. The fastest way to get your results is to register for My Chart.   IF you received an x-ray today, you will receive an invoice from [facility]. Please contact [facility] at [phone] with questions or concerns regarding your invoice.   IF you received  labwork today, you will receive an invoice from [facility]. Please contact [facility] at [phone] with questions or concerns regarding your invoice.   Our billing staff will not be able to assist you with questions regarding bills from these companies.  You will be contacted with the lab results as soon as they are available. The fastest way to get your results is to activate your My Chart account. Instructions are located on the last page of this paperwork. If you have not heard from [place] regarding the results in 2 weeks, please contact this office.       Health Maintenance, Male Adopting a healthy lifestyle and getting preventive care are important in promoting health and wellness. Ask your health care provider about:  The right schedule for you to have regular tests and exams.  Things you can do on your own to prevent diseases and keep yourself healthy. What should I know about diet, weight, and exercise? Eat a healthy diet   Eat a diet that includes plenty of vegetables, fruits, low-fat dairy products, and lean protein.  Do not eat a lot of foods that are high in solid fats, added sugars, or sodium. Maintain a healthy weight Body mass index (BMI) is a measurement that can be used to identify possible weight problems. It estimates body fat based on height and weight. Your health care provider can help determine your BMI and help you achieve or maintain a healthy weight. Get regular exercise Get regular exercise. This is one of the most important things you can do for your health. Most adults should:  Exercise for at least 150 minutes each week. The exercise should  increase your heart rate and make you sweat (moderate-intensity exercise).  Do strengthening exercises at least twice a week. This is in addition to the moderate-intensity exercise.  Spend less time sitting. Even light physical activity can be beneficial. Watch cholesterol and blood lipids Have your blood tested for lipids and cholesterol at 27 years of age, then have this test every 5 years. You may need to have your cholesterol levels checked more often if:  Your lipid or cholesterol levels are high.  You are older than 27 years of age.  You are at high risk for heart disease. What should I know about cancer screening? Many types of cancers can be detected early and may often be prevented. Depending on your health history and family history, you may need to have cancer screening at various ages. This may include screening for:  Colorectal cancer.  Prostate cancer.  Skin cancer.  Lung cancer. What should I know about heart disease, diabetes, and high blood pressure? Blood pressure and heart disease  High blood pressure causes heart disease and increases the risk of stroke. This is more likely to develop in people who have high blood pressure readings, are of African descent, or are overweight.  Talk with your health care provider about your target blood pressure readings.  Have your blood pressure checked: ? Every 3-5 years if you are 6-38 years of age. ? Every year if you are 41 years old or older.  If you are between the ages of 28 and 63 and are a current or former smoker, ask your health care provider if you should have a one-time screening for abdominal aortic aneurysm (AAA). Diabetes Have regular diabetes screenings. This checks your fasting blood sugar level. Have the screening done:  Once every three years after age 26 if you are at a normal weight and  have a low risk for diabetes.  More often and at a younger age if you are overweight or have a high risk for  diabetes. What should I know about preventing infection? Hepatitis B If you have a higher risk for hepatitis B, you should be screened for this virus. Talk with your health care provider to find out if you are at risk for hepatitis B infection. Hepatitis C Blood testing is recommended for:  Everyone born from [birth year] through 1964.  Anyone with known risk factors for hepatitis C. Sexually transmitted infections (STIs)  You should be screened each year for STIs, including gonorrhea and chlamydia, if: ? You are sexually active and are younger than 27 years of age. ? You are older than 27 years of age and your health care provider tells you that you are at risk for this type of infection. ? Your sexual activity has changed since you were last screened, and you are at increased risk for chlamydia or gonorrhea. Ask your health care provider if you are at risk.  Ask your health care provider about whether you are at high risk for HIV. Your health care provider may recommend a prescription medicine to help prevent HIV infection. If you choose to take medicine to prevent HIV, you should first get tested for HIV. You should then be tested every 3 months for as long as you are taking the medicine. Follow these instructions at home: Lifestyle  Do not use any products that contain nicotine or tobacco, such as cigarettes, e-cigarettes, and chewing tobacco. If you need help quitting, ask your health care provider.  Do not use street drugs.  Do not share needles.  Ask your health care provider for help if you need support or information about quitting drugs. Alcohol use  Do not drink alcohol if your health care provider tells you not to drink.  If you drink alcohol: ? Limit how much you have to 0-2 drinks a day. ? Be aware of how much alcohol is in your drink. In the [place] one drink equals one 12 oz bottle of beer (355 mL), one 5 oz glass of wine (148 mL), or one 1 oz glass of hard liquor (44 mL).  General instructions  Schedule regular health, dental, and eye exams.  Stay current with your vaccines.  Tell your health care provider if: ? You often feel depressed. ? You have ever been abused or do not feel safe at home. Summary  Adopting a healthy lifestyle and getting preventive care are important in promoting health and wellness.  Follow your health care provider's instructions about healthy diet, exercising, and getting tested or screened for diseases.  Follow your health care provider's instructions on monitoring your cholesterol and blood pressure. This information is not intended to replace advice given to you by your health care provider. Make sure you discuss any questions you have with your health care provider. Document Released: 01/31/2008 Document Revised: 07/28/2018 Document Reviewed: 07/28/2018 Elsevier Patient Education  2020 [employer]      Edwina BarthMiguel Kessie Croston, MD [facility]

## 2019-03-03 ENCOUNTER — Ambulatory Visit (INDEPENDENT_AMBULATORY_CARE_PROVIDER_SITE_OTHER): Payer: BC Managed Care – PPO | Admitting: Emergency Medicine

## 2019-03-03 ENCOUNTER — Other Ambulatory Visit: Payer: Self-pay

## 2019-03-03 DIAGNOSIS — Z111 Encounter for screening for respiratory tuberculosis: Secondary | ICD-10-CM | POA: Diagnosis not present

## 2019-03-03 LAB — NOVEL CORONAVIRUS, NAA: SARS-CoV-2, NAA: NOT DETECTED

## 2019-03-03 LAB — GC/CHLAMYDIA PROBE AMP (~~LOC~~) NOT AT ARMC
Chlamydia: NEGATIVE
Neisseria Gonorrhea: NEGATIVE

## 2019-03-03 LAB — TB SKIN TEST
Induration: 0 mm
TB Skin Test: NEGATIVE

## 2019-03-03 NOTE — Patient Instructions (Signed)
Ppd reading

## 2019-03-04 ENCOUNTER — Other Ambulatory Visit: Payer: Self-pay | Admitting: *Deleted

## 2019-03-04 ENCOUNTER — Ambulatory Visit: Payer: BC Managed Care – PPO

## 2019-03-04 DIAGNOSIS — Z20822 Contact with and (suspected) exposure to covid-19: Secondary | ICD-10-CM

## 2019-03-11 ENCOUNTER — Other Ambulatory Visit: Payer: Self-pay

## 2019-03-11 DIAGNOSIS — Z20822 Contact with and (suspected) exposure to covid-19: Secondary | ICD-10-CM

## 2019-03-19 LAB — NOVEL CORONAVIRUS, NAA: SARS-CoV-2, NAA: NOT DETECTED
